# Patient Record
Sex: Male | Born: 1955
Health system: Southern US, Community
[De-identification: ages and names within clinical notes are randomized; demographics above are authoritative.]

## PROBLEM LIST (undated history)

## (undated) DIAGNOSIS — I10 Essential (primary) hypertension: Secondary | ICD-10-CM

## (undated) DIAGNOSIS — J45909 Unspecified asthma, uncomplicated: Secondary | ICD-10-CM

## (undated) DIAGNOSIS — M1990 Unspecified osteoarthritis, unspecified site: Secondary | ICD-10-CM

## (undated) HISTORY — PX: ORTHOPEDIC SURGERY: SHX850

## (undated) HISTORY — PX: TONSILLECTOMY: SUR1361

---

## 2012-09-22 ENCOUNTER — Encounter (HOSPITAL_COMMUNITY): Payer: Self-pay | Admitting: *Deleted

## 2012-09-22 ENCOUNTER — Observation Stay (HOSPITAL_COMMUNITY)
Admission: EM | Admit: 2012-09-22 | Discharge: 2012-09-23 | Disposition: A | Discharge status: Home / Self Care | Payer: PRIVATE HEALTH INSURANCE | Source: Home / Self Care | Admitting: Orthopedic Surgery

## 2012-09-22 ENCOUNTER — Encounter (HOSPITAL_COMMUNITY): Admission: EM | Disposition: A | Discharge status: Home / Self Care | Payer: Self-pay | Source: Home / Self Care

## 2012-09-22 ENCOUNTER — Emergency Department (HOSPITAL_COMMUNITY): Payer: PRIVATE HEALTH INSURANCE

## 2012-09-22 ENCOUNTER — Emergency Department (HOSPITAL_COMMUNITY)
Admission: EM | Admit: 2012-09-22 | Discharge: 2012-09-22 | Disposition: A | Discharge status: Home / Self Care | Payer: PRIVATE HEALTH INSURANCE | Attending: Orthopedic Surgery | Admitting: Orthopedic Surgery

## 2012-09-22 ENCOUNTER — Emergency Department (HOSPITAL_COMMUNITY): Payer: PRIVATE HEALTH INSURANCE | Admitting: *Deleted

## 2012-09-22 ENCOUNTER — Encounter (HOSPITAL_COMMUNITY): Payer: Self-pay

## 2012-09-22 DIAGNOSIS — S61209A Unspecified open wound of unspecified finger without damage to nail, initial encounter: Secondary | ICD-10-CM | POA: Insufficient documentation

## 2012-09-22 DIAGNOSIS — Y9389 Activity, other specified: Secondary | ICD-10-CM | POA: Insufficient documentation

## 2012-09-22 DIAGNOSIS — Z79899 Other long term (current) drug therapy: Secondary | ICD-10-CM | POA: Insufficient documentation

## 2012-09-22 DIAGNOSIS — S62639B Displaced fracture of distal phalanx of unspecified finger, initial encounter for open fracture: Secondary | ICD-10-CM | POA: Insufficient documentation

## 2012-09-22 DIAGNOSIS — I10 Essential (primary) hypertension: Secondary | ICD-10-CM | POA: Insufficient documentation

## 2012-09-22 DIAGNOSIS — W540XXA Bitten by dog, initial encounter: Secondary | ICD-10-CM | POA: Insufficient documentation

## 2012-09-22 DIAGNOSIS — F17200 Nicotine dependence, unspecified, uncomplicated: Secondary | ICD-10-CM | POA: Insufficient documentation

## 2012-09-22 DIAGNOSIS — Z791 Long term (current) use of non-steroidal anti-inflammatories (NSAID): Secondary | ICD-10-CM | POA: Insufficient documentation

## 2012-09-22 DIAGNOSIS — M129 Arthropathy, unspecified: Secondary | ICD-10-CM | POA: Insufficient documentation

## 2012-09-22 DIAGNOSIS — S61259A Open bite of unspecified finger without damage to nail, initial encounter: Secondary | ICD-10-CM

## 2012-09-22 DIAGNOSIS — Y929 Unspecified place or not applicable: Secondary | ICD-10-CM | POA: Insufficient documentation

## 2012-09-22 HISTORY — DX: Unspecified osteoarthritis, unspecified site: M19.90

## 2012-09-22 HISTORY — PX: PERCUTANEOUS PINNING: SHX2209

## 2012-09-22 HISTORY — DX: Unspecified asthma, uncomplicated: J45.909

## 2012-09-22 HISTORY — DX: Essential (primary) hypertension: I10

## 2012-09-22 LAB — BASIC METABOLIC PANEL
BUN: 11 mg/dL (ref 6–23)
Calcium: 9.3 mg/dL (ref 8.4–10.5)
Creatinine, Ser: 1 mg/dL (ref 0.50–1.35)
GFR calc Af Amer: >90 mL/min (ref >90)
GFR calc non Af Amer: 82 mL/min — ABNORMAL LOW (ref >90)

## 2012-09-22 LAB — CBC
MCH: 31.8 pg (ref 26.0–34.0)
MCHC: 36.1 g/dL — ABNORMAL HIGH (ref 30.0–36.0)
MCV: 88.2 fL (ref 78.0–100.0)
Platelets: 282 10*3/uL (ref 150–400)
RDW: 12.6 % (ref 11.5–15.5)
WBC: 8.6 10*3/uL (ref 4.0–10.5)

## 2012-09-22 SURGERY — PINNING, EXTREMITY, PERCUTANEOUS
Anesthesia: General | Site: Hand | Laterality: Bilateral | Wound class: Dirty or Infected

## 2012-09-22 SURGERY — PINNING, EXTREMITY, PERCUTANEOUS
Anesthesia: General | Laterality: Bilateral

## 2012-09-22 MED ORDER — HYDROMORPHONE HCL PF 1 MG/ML IJ SOLN: 0.5000 mg | INTRAMUSCULAR | Status: DC | PRN

## 2012-09-22 MED ORDER — HYDROMORPHONE HCL PF 1 MG/ML IJ SOLN: 0.2500 mg | INTRAMUSCULAR | Status: DC | PRN

## 2012-09-22 MED ORDER — NEOSTIGMINE METHYLSULFATE 1 MG/ML IJ SOLN
INTRAMUSCULAR | Status: DC | PRN
  Administered 2012-09-22: 5 mg via INTRAVENOUS

## 2012-09-22 MED ORDER — KCL IN DEXTROSE-NACL 20-5-0.45 MEQ/L-%-% IV SOLN
INTRAVENOUS | Status: DC
  Administered 2012-09-23: via INTRAVENOUS
  Filled 2012-09-22 (×2): qty 1000

## 2012-09-22 MED ORDER — VITAMIN C 500 MG PO TABS
1000.0000 mg | ORAL_TABLET | Freq: Every day | ORAL | Status: DC
  Administered 2012-09-23: 1000 mg via ORAL
  Filled 2012-09-22: qty 2

## 2012-09-22 MED ORDER — ONDANSETRON HCL 4 MG/2ML IJ SOLN
INTRAMUSCULAR | Status: DC | PRN
  Administered 2012-09-22: 4 mg via INTRAVENOUS

## 2012-09-22 MED ORDER — ADULT MULTIVITAMIN W/MINERALS CH
1.0000 | ORAL_TABLET | Freq: Every day | ORAL | Status: DC
  Administered 2012-09-23: 1 via ORAL
  Filled 2012-09-22: qty 1

## 2012-09-22 MED ORDER — ONDANSETRON HCL 4 MG PO TABS: 4.0000 mg | ORAL_TABLET | Freq: Four times a day (QID) | ORAL | Status: DC | PRN

## 2012-09-22 MED ORDER — ROCURONIUM BROMIDE 100 MG/10ML IV SOLN
INTRAVENOUS | Status: DC | PRN
  Administered 2012-09-22: 50 mg via INTRAVENOUS
  Administered 2012-09-22: 20 mg via INTRAVENOUS

## 2012-09-22 MED ORDER — LIDOCAINE HCL (CARDIAC) 20 MG/ML IV SOLN
INTRAVENOUS | Status: DC | PRN
  Administered 2012-09-22: 80 mg via INTRAVENOUS

## 2012-09-22 MED ORDER — GLYCOPYRROLATE 0.2 MG/ML IJ SOLN
INTRAMUSCULAR | Status: DC | PRN
  Administered 2012-09-22: 0.6 mg via INTRAVENOUS

## 2012-09-22 MED ORDER — DEXTROSE 5 % IV SOLN: 1.0000 g | Freq: Once | INTRAVENOUS | Status: DC

## 2012-09-22 MED ORDER — ONDANSETRON HCL 4 MG/2ML IJ SOLN: 4.0000 mg | Freq: Four times a day (QID) | INTRAMUSCULAR | Status: DC | PRN

## 2012-09-22 MED ORDER — HYDROCODONE-ACETAMINOPHEN 7.5-325 MG PO TABS
1.0000 | ORAL_TABLET | ORAL | Status: DC | PRN
  Administered 2012-09-23 (×3): 2 via ORAL
  Filled 2012-09-22 (×3): qty 2

## 2012-09-22 MED ORDER — HYDROMORPHONE HCL PF 1 MG/ML IJ SOLN
1.0000 mg | Freq: Once | INTRAMUSCULAR | Status: AC
  Administered 2012-09-22: 1 mg via INTRAMUSCULAR
  Filled 2012-09-22: qty 1

## 2012-09-22 MED ORDER — OXYCODONE HCL 5 MG PO TABS
5.0000 mg | ORAL_TABLET | ORAL | Status: DC | PRN
  Filled 2012-09-22: qty 2

## 2012-09-22 MED ORDER — MIDAZOLAM HCL 5 MG/5ML IJ SOLN
INTRAMUSCULAR | Status: DC | PRN
  Administered 2012-09-22: 2 mg via INTRAVENOUS

## 2012-09-22 MED ORDER — AMPICILLIN-SULBACTAM SODIUM 3 (2-1) G IJ SOLR
3.0000 g | Freq: Four times a day (QID) | INTRAMUSCULAR | Status: DC
  Administered 2012-09-23 (×4): 3 g via INTRAVENOUS
  Filled 2012-09-22 (×7): qty 3

## 2012-09-22 MED ORDER — DEXAMETHASONE SODIUM PHOSPHATE 4 MG/ML IJ SOLN
INTRAMUSCULAR | Status: DC | PRN
  Administered 2012-09-22: 10 mg via INTRAVENOUS

## 2012-09-22 MED ORDER — DOCUSATE SODIUM 100 MG PO CAPS
100.0000 mg | ORAL_CAPSULE | Freq: Two times a day (BID) | ORAL | Status: DC
Start: 2012-09-23 — End: 2012-09-23
  Administered 2012-09-23: 100 mg via ORAL
  Filled 2012-09-22 (×3): qty 1

## 2012-09-22 MED ORDER — DIPHENHYDRAMINE HCL 25 MG PO CAPS: 25.0000 mg | ORAL_CAPSULE | Freq: Four times a day (QID) | ORAL | Status: DC | PRN

## 2012-09-22 MED ORDER — BUPIVACAINE HCL (PF) 0.25 % IJ SOLN
INTRAMUSCULAR | Status: AC
  Filled 2012-09-22: qty 30

## 2012-09-22 MED ORDER — PROPOFOL 10 MG/ML IV BOLUS
INTRAVENOUS | Status: DC | PRN
  Administered 2012-09-22: 180 mg via INTRAVENOUS

## 2012-09-22 MED ORDER — DEXTROSE 5 % IV SOLN
1.0000 g | Freq: Once | INTRAVENOUS | Status: AC
  Administered 2012-09-22: 1 g via INTRAVENOUS
  Filled 2012-09-22: qty 10

## 2012-09-22 MED ORDER — OXYCODONE HCL 5 MG/5ML PO SOLN: 5.0000 mg | Freq: Once | ORAL | Status: DC | PRN

## 2012-09-22 MED ORDER — FENTANYL CITRATE 0.05 MG/ML IJ SOLN
INTRAMUSCULAR | Status: DC | PRN
  Administered 2012-09-22: 100 ug via INTRAVENOUS
  Administered 2012-09-22 (×3): 50 ug via INTRAVENOUS
  Administered 2012-09-22: 100 ug via INTRAVENOUS

## 2012-09-22 MED ORDER — CLINDAMYCIN PHOSPHATE 900 MG/50ML IV SOLN
900.0000 mg | INTRAVENOUS | Status: AC
  Administered 2012-09-22: 900 mg via INTRAVENOUS
  Filled 2012-09-22: qty 50

## 2012-09-22 MED ORDER — BACITRACIN-NEOMYCIN-POLYMYXIN 400-5-5000 EX OINT
TOPICAL_OINTMENT | Freq: Once | CUTANEOUS | Status: AC
  Administered 2012-09-22: 13:00:00 via TOPICAL
  Filled 2012-09-22: qty 1

## 2012-09-22 MED ORDER — OXYCODONE HCL 5 MG PO TABS: 5.0000 mg | ORAL_TABLET | Freq: Once | ORAL | Status: DC | PRN

## 2012-09-22 MED ORDER — FENTANYL CITRATE 0.05 MG/ML IJ SOLN
50.0000 ug | Freq: Once | INTRAMUSCULAR | Status: AC
  Administered 2012-09-22: 50 ug via INTRAMUSCULAR
  Filled 2012-09-22: qty 2

## 2012-09-22 MED ORDER — ZOLPIDEM TARTRATE 5 MG PO TABS: 5.0000 mg | ORAL_TABLET | Freq: Every evening | ORAL | Status: DC | PRN

## 2012-09-22 MED ORDER — METHOCARBAMOL 500 MG PO TABS: 500.0000 mg | ORAL_TABLET | Freq: Four times a day (QID) | ORAL | Status: DC | PRN

## 2012-09-22 MED ORDER — ARTIFICIAL TEARS OP OINT
TOPICAL_OINTMENT | OPHTHALMIC | Status: DC | PRN
  Administered 2012-09-22: 1 via OPHTHALMIC

## 2012-09-22 MED ORDER — DEXTROSE 5 % IV SOLN
500.0000 mg | Freq: Four times a day (QID) | INTRAVENOUS | Status: DC | PRN
  Filled 2012-09-22: qty 5

## 2012-09-22 MED ORDER — LIDOCAINE HCL (PF) 1 % IJ SOLN
INTRAMUSCULAR | Status: AC
  Filled 2012-09-22: qty 15

## 2012-09-22 MED ORDER — DEXTROSE 5 % IV SOLN
INTRAVENOUS | Status: DC | PRN
  Administered 2012-09-22: 21:00:00 via INTRAVENOUS

## 2012-09-22 MED ORDER — LACTATED RINGERS IV SOLN
INTRAVENOUS | Status: DC | PRN
  Administered 2012-09-22 (×2): via INTRAVENOUS

## 2012-09-22 MED ORDER — BUPIVACAINE HCL 0.25 % IJ SOLN
INTRAMUSCULAR | Status: DC | PRN
  Administered 2012-09-22: 6 mL
  Administered 2012-09-22: 8 mL

## 2012-09-22 MED ORDER — 0.9 % SODIUM CHLORIDE (POUR BTL) OPTIME
TOPICAL | Status: DC | PRN
  Administered 2012-09-22 (×2): 1000 mL

## 2012-09-22 SURGICAL SUPPLY — 54 items
BANDAGE CONFORM 2  STR LF (GAUZE/BANDAGES/DRESSINGS) ×6 IMPLANT
BANDAGE ELASTIC 3 VELCRO ST LF (GAUZE/BANDAGES/DRESSINGS) IMPLANT
BANDAGE ELASTIC 4 VELCRO ST LF (GAUZE/BANDAGES/DRESSINGS) IMPLANT
BANDAGE GAUZE ELAST BULKY 4 IN (GAUZE/BANDAGES/DRESSINGS) IMPLANT
BENZOIN TINCTURE PRP APPL 2/3 (GAUZE/BANDAGES/DRESSINGS) IMPLANT
BLADE SURG ROTATE 9660 (MISCELLANEOUS) IMPLANT
BNDG COHESIVE 1X5 TAN STRL LF (GAUZE/BANDAGES/DRESSINGS) ×4 IMPLANT
BNDG ESMARK 4X9 LF (GAUZE/BANDAGES/DRESSINGS) ×2 IMPLANT
CANISTER SUCTION 2500CC (MISCELLANEOUS) ×2 IMPLANT
CLOTH BEACON ORANGE TIMEOUT ST (SAFETY) ×2 IMPLANT
COVER SURGICAL LIGHT HANDLE (MISCELLANEOUS) ×2 IMPLANT
CUFF TOURNIQUET SINGLE 18IN (TOURNIQUET CUFF) ×4 IMPLANT
CUFF TOURNIQUET SINGLE 24IN (TOURNIQUET CUFF) IMPLANT
DRAPE C-ARM MINI 42X72 WSTRAPS (DRAPES) ×2 IMPLANT
DRAPE SURG 17X23 STRL (DRAPES) ×4 IMPLANT
DRSG EMULSION OIL 3X3 NADH (GAUZE/BANDAGES/DRESSINGS) ×4 IMPLANT
GAUZE SPONGE 2X2 8PLY STRL LF (GAUZE/BANDAGES/DRESSINGS) ×3 IMPLANT
GAUZE XEROFORM 1X8 LF (GAUZE/BANDAGES/DRESSINGS) IMPLANT
GLOVE BIOGEL PI IND STRL 7.5 (GLOVE) ×2 IMPLANT
GLOVE BIOGEL PI IND STRL 8 (GLOVE) ×1 IMPLANT
GLOVE BIOGEL PI IND STRL 8.5 (GLOVE) ×3 IMPLANT
GLOVE BIOGEL PI INDICATOR 7.5 (GLOVE) ×2
GLOVE BIOGEL PI INDICATOR 8 (GLOVE) ×1
GLOVE BIOGEL PI INDICATOR 8.5 (GLOVE) ×3
GLOVE SURG ORTHO 8.0 STRL STRW (GLOVE) ×6 IMPLANT
GLOVE SURG SS PI 7.5 STRL IVOR (GLOVE) ×6 IMPLANT
GOWN SRG XL XLNG 56XLVL 4 (GOWN DISPOSABLE) ×4 IMPLANT
GOWN STRL NON-REIN LRG LVL3 (GOWN DISPOSABLE) IMPLANT
GOWN STRL NON-REIN XL XLG LVL4 (GOWN DISPOSABLE) ×4
KIT BASIN OR (CUSTOM PROCEDURE TRAY) ×2 IMPLANT
KIT ROOM TURNOVER OR (KITS) ×2 IMPLANT
KWIRE 4.0 X .045IN (WIRE) ×2 IMPLANT
MANIFOLD NEPTUNE II (INSTRUMENTS) IMPLANT
NEEDLE HYPO 25GX1X1/2 BEV (NEEDLE) ×2 IMPLANT
NS IRRIG 1000ML POUR BTL (IV SOLUTION) ×4 IMPLANT
PACK ORTHO EXTREMITY (CUSTOM PROCEDURE TRAY) ×2 IMPLANT
PAD ARMBOARD 7.5X6 YLW CONV (MISCELLANEOUS) ×4 IMPLANT
SCRUB FOAM CHG 2% SURGICAL (MISCELLANEOUS) ×4 IMPLANT
SPONGE GAUZE 2X2 STER 10/PKG (GAUZE/BANDAGES/DRESSINGS) ×3
SPONGE GAUZE 4X4 12PLY (GAUZE/BANDAGES/DRESSINGS) IMPLANT
STRIP CLOSURE SKIN 1/2X4 (GAUZE/BANDAGES/DRESSINGS) IMPLANT
SUT CHROMIC 5 0 P 3 (SUTURE) ×4 IMPLANT
SUT ETHILON 4 0 P 3 18 (SUTURE) IMPLANT
SUT ETHILON 5 0 P 3 18 (SUTURE)
SUT MERSILENE 4 0 P 3 (SUTURE) ×2 IMPLANT
SUT NYLON ETHILON 5-0 P-3 1X18 (SUTURE) IMPLANT
SUT PROLENE 4 0 P 3 18 (SUTURE) ×4 IMPLANT
SUT PROLENE 4 0 RB 1 (SUTURE) ×1
SUT PROLENE 4-0 RB1 18X2 ARM (SUTURE) ×1 IMPLANT
SYR CONTROL 10ML LL (SYRINGE) ×2 IMPLANT
TOWEL OR 17X24 6PK STRL BLUE (TOWEL DISPOSABLE) ×2 IMPLANT
TOWEL OR 17X26 10 PK STRL BLUE (TOWEL DISPOSABLE) ×2 IMPLANT
TUBE CONNECTING 12X1/4 (SUCTIONS) ×2 IMPLANT
WATER STERILE IRR 1000ML POUR (IV SOLUTION) IMPLANT

## 2012-09-22 NOTE — ED Notes (Signed)
PT is here to see Dr. Melvyn Novas  For animal bites to bilateral hands from a dog he knows.  Rabies shot up to date

## 2012-09-22 NOTE — ED Notes (Signed)
Family at bedside. 

## 2012-09-22 NOTE — Anesthesia Postprocedure Evaluation (Signed)
Anesthesia Post Note  Patient: Justin Gill  Procedure(s) Performed: Procedure(s) (LRB): Bilateral hands/I&D and Repair as Needed (Bilateral)  Anesthesia type: General  Patient location: PACU  Post pain: Pain level controlled and Adequate analgesia  Post assessment: Post-op Vital signs reviewed, Patient's Cardiovascular Status Stable, Respiratory Function Stable, Patent Airway and Pain level controlled  Last Vitals:  Filed Vitals:   09/22/12 2300  BP: 145/92  Pulse:   Temp:   Resp:     Post vital signs: Reviewed and stable  Level of consciousness: awake, alert  and oriented  Complications: No apparent anesthesia complications

## 2012-09-22 NOTE — Preoperative (Signed)
Beta Blockers   Reason not to administer Beta Blockers:Not Applicable 

## 2012-09-22 NOTE — Transfer of Care (Signed)
Immediate Anesthesia Transfer of Care Note  Patient: Justin Gill  Procedure(s) Performed: Procedure(s): Bilateral hands/I&D and Repair as Needed (Bilateral)  Patient Location: PACU  Anesthesia Type:General  Level of Consciousness: oriented, sedated, patient cooperative and responds to stimulation  Airway & Oxygen Therapy: Patient Spontanous Breathing and Patient connected to nasal cannula oxygen  Post-op Assessment: Report given to PACU RN, Post -op Vital signs reviewed and stable, Patient moving all extremities and Patient moving all extremities X 4  Post vital signs: Reviewed and stable  Complications: No apparent anesthesia complications

## 2012-09-22 NOTE — ED Provider Notes (Addendum)
History    This chart was scribed for Vida Roller, MD by Charolett Bumpers, ED Scribe. The patient was seen in room APA06/APA06. Patient's care was started at 1031.   CSN: [id number]  Arrival date & time 09/22/12  1013   First MD Initiated Contact with Patient 09/22/12 1031      Chief Complaint  Patient presents with  . Animal Bite    The history is provided by the patient. No language interpreter was used.   Justin Gill is a 57 y.o. male who presents to the Emergency Department complaining of constant, moderate bilaterally hand pain with associated wounds after a dog bite that occurred last night. He has dog bites to his left pinky, right ring finger and right pinky fingers. He states that he was giving a bone to one of his dogs when one of his pit bulls bit him on both of his hands. He states that the dog's immunizations and rabies are UTD per pt. He went to his PCP this morning where his fingers were dressed. He states the fingers are mangled and the left pinky is severely damaged. He denies any other injuries. His tetanus is UTD.  Past Medical History  Diagnosis Date  . Hypertension   . Arthritis     Past Surgical History  Procedure Laterality Date  . Tonsillectomy    . Orthopedic surgery      No family history on file.  History  Substance Use Topics  . Smoking status: Current Every Day Smoker  . Smokeless tobacco: Not on file  . Alcohol Use: Yes      Review of Systems A complete 10 system review of systems was obtained and all systems are negative except as noted in the HPI and PMH.   Allergies  Review of patient's allergies indicates no known allergies.  Home Medications   Current Outpatient Rx  Name  Route  Sig  Dispense  Refill  . albuterol (PROVENTIL HFA;VENTOLIN HFA) 108 (90 BASE) MCG/ACT inhaler   Inhalation   Inhale 2 puffs into the lungs every 6 (six) hours as needed for wheezing.         Marland Kitchen amLODipine (NORVASC) 5 MG tablet   Oral   Take  5 mg by mouth daily.         . mometasone-formoterol (DULERA) 200-5 MCG/ACT AERO   Inhalation   Inhale 2 puffs into the lungs daily.         . naproxen sodium (ALEVE) 220 MG tablet   Oral   Take 220 mg by mouth 2 (two) times daily as needed.           BP 139/86  Pulse 100  Temp(Src) 97.8 F (36.6 C) (Oral)  Resp 20  Ht 5\' 11"  (1.803 m)  Wt 178 lb (80.74 kg)  BMI 24.84 kg/m2  SpO2 96%  Physical Exam  Nursing note and vitals reviewed. Constitutional: He is oriented to person, place, and time. He appears well-developed and well-nourished. No distress.  HENT:  Head: Normocephalic and atraumatic.  Eyes: EOM are normal. Pupils are equal, round, and reactive to light.  Neck: Normal range of motion. Neck supple. No tracheal deviation present.  Cardiovascular: Normal rate, regular rhythm and normal heart sounds.   Pulmonary/Chest: Effort normal and breath sounds normal. No respiratory distress.  Abdominal: Soft. He exhibits no distension.  Musculoskeletal: Normal range of motion. He exhibits no edema.  Left little finger with deep laceration through the mid distal phalanx. X-ray  confirms a fracture of the same bone. On the right hand, dorsal surface of ring and little fingers mangled. Right ring finger with distal tuff involved and open, missing soft tissue. Right little finger with laceration and mangled dorsal surface.   Neurological: He is alert and oriented to person, place, and time.  Skin: Skin is warm and dry.  Psychiatric: He has a normal mood and affect. His behavior is normal.    ED Course  Procedures (including critical care time)  DIAGNOSTIC STUDIES: Oxygen Saturation is 96% on room air, adequate by my interpretation.    COORDINATION OF CARE:  10:45-Medication Orders: Hydromorphone (Dilaudid) injection 1 mg-once.   11:25-Discussed planned course of treatment with the patient including a digital block and wound cleaning, who is agreeable at this time. Will  have pt f/u with hand surgery.  11:38-Medication Orders: Lidocaine (PF) (Xylocaine) 1% injection.  11:40-Preformed digital block and and cleaning of wounds.   12:10-Spoke with hand surgery. Pt to go to [facility] ED for repair. He is agreeable with plan.  Labs Reviewed - No data to display Dg Hand Complete Left  09/22/2012  *RADIOLOGY REPORT*  Clinical Data: Injury, laceration.  LEFT HAND - COMPLETE 3+ VIEW  Comparison: None.  Findings: There is a large laceration of the distal little finger. The fracture extends through the neck of the distal phalanx without marked displacement.  Bandaging is present but no foreign body is identified.  IMPRESSION: Laceration of the little finger extends through the neck of the distal phalanx.   Original Report Authenticated By: Holley Dexter, M.D.    Dg Hand Complete Right  09/22/2012  *RADIOLOGY REPORT*  Clinical Data: Ring and little finger lacerations.  RIGHT HAND - COMPLETE 3+ VIEW  Comparison: None.  Findings: Large appearing laceration is seen involving the distal aspect of the ring finger.  The distal most aspect of the tuft of the ring finger has been amputated.  Laceration of the little finger is also identified without fracture.  No foreign body is seen.  There is scattered degenerative disease present with small osteophytes noted about the DIP joint of the little finger.  IMPRESSION:  1.  Large laceration distal ring finger involves the tip of the tuft of the distal phalanx. 2.  Laceration little finger without fracture.   Original Report Authenticated By: Holley Dexter, M.D.      1. Animal bite of finger, initial encounter       MDM  I have discussed the patient's care with the hand surgeon Dr. Melvyn Novas who has requested that the patient be sent to [facility] for further evaluation and possible procedure. I have given the patient antibiotics, cleaned and dressed his wounds and placed antibiotic cream on them. He has also had a  digital block of his left small finger which has had significant improvement in his pain. The patient appears stable at this time, I personally reviewed and interpreted the x-rays and find her to be a laceration of the left small finger, significant soft tissue damage to all 3 fingers involved. The patient is stable for personal transport, the patient prefers to go by this route and appears stable to do so. He has a friend here to drive him.    I personally performed the services described in this documentation, which was scribed in my presence. The recorded information has been reviewed and is accurate.        Vida Roller, MD 09/22/12 1222  Vida Roller, MD  09/22/12 1222 

## 2012-09-22 NOTE — ED Notes (Signed)
Dressing changed for right hand.

## 2012-09-22 NOTE — Anesthesia Procedure Notes (Signed)
Procedure Name: Intubation Date/Time: 09/22/2012 8:57 PM Performed by: Wray Kearns A Pre-anesthesia Checklist: Patient identified, Timeout performed, Emergency Drugs available, Suction available and Patient being monitored Patient Re-evaluated:Patient Re-evaluated prior to inductionOxygen Delivery Method: Circle system utilized Preoxygenation: Pre-oxygenation with 100% oxygen Intubation Type: IV induction Ventilation: Mask ventilation without difficulty Laryngoscope Size: Mac and 4 Grade View: Grade II Tube type: Oral Tube size: 7.5 mm Number of attempts: 1 Airway Equipment and Method: Stylet Placement Confirmation: ETT inserted through vocal cords under direct vision,  breath sounds checked- equal and bilateral and positive ETCO2 Secured at: 23 cm Tube secured with: Tape Dental Injury: Teeth and Oropharynx as per pre-operative assessment

## 2012-09-22 NOTE — ED Notes (Signed)
Left hand soaking in saline at present. Waiting to be sutured

## 2012-09-22 NOTE — ED Notes (Signed)
Consent for procedure signed.

## 2012-09-22 NOTE — Anesthesia Preprocedure Evaluation (Signed)
Anesthesia Evaluation  Patient identified by MRN, date of birth, ID band Patient awake    Reviewed: Allergy & Precautions, H&P , NPO status , Patient's Chart, lab work & pertinent test results  Airway Mallampati: II  Neck ROM: full    Dental   Pulmonary asthma , Current Smoker,          Cardiovascular hypertension,     Neuro/Psych    GI/Hepatic   Endo/Other    Renal/GU      Musculoskeletal  (+) Arthritis -,   Abdominal   Peds  Hematology   Anesthesia Other Findings   Reproductive/Obstetrics                           Anesthesia Physical Anesthesia Plan  ASA: II  Anesthesia Plan: General   Post-op Pain Management:    Induction: Intravenous  Airway Management Planned: LMA  Additional Equipment:   Intra-op Plan:   Post-operative Plan:   Informed Consent: I have reviewed the patients History and Physical, chart, labs and discussed the procedure including the risks, benefits and alternatives for the proposed anesthesia with the patient or authorized representative who has indicated his/her understanding and acceptance.     Plan Discussed with: CRNA and Surgeon  Anesthesia Plan Comments:         Anesthesia Quick Evaluation

## 2012-09-22 NOTE — ED Notes (Signed)
Pt states he was attempting to take a bone away from his pit bull last night and it bit him. States he went to his doctor this morning a fingers were dressed. States his fingers are mangled and the left pinky is about to fall off

## 2012-09-22 NOTE — H&P (Signed)
Justin Gill is an 57 y.o. male.   Chief Complaint:  PT BIT BY HOME PIT BULL LAST EVENING HPI: PRESENTED TO URGENT CARE AND [place] TODAY WITH OPEN WOUNDS TO FINGERS PT WITH NO PRIOR INJURY TO EITHER HANDS PT HERE FOR DEFINITIVE TREATMENT FOR BOTH HANDS THAT WERE MANGLED BY DOG  Past Medical History  Diagnosis Date  . Hypertension   . Arthritis   . Asthma     Past Surgical History  Procedure Laterality Date  . Tonsillectomy    . Orthopedic surgery      No family history on file. Social History:  reports that he has been smoking.  He does not have any smokeless tobacco history on file. He reports that  drinks alcohol. He reports that he does not use illicit drugs.  Allergies: No Known Allergies   (Not in a hospital admission)  No results found for this or any previous visit (from the past 48 hour(s)). Dg Hand Complete Left  09/22/2012  *RADIOLOGY REPORT*  Clinical Data: Injury, laceration.  LEFT HAND - COMPLETE 3+ VIEW  Comparison: None.  Findings: There is a large laceration of the distal little finger. The fracture extends through the neck of the distal phalanx without marked displacement.  Bandaging is present but no foreign body is identified.  IMPRESSION: Laceration of the little finger extends through the neck of the distal phalanx.   Original Report Authenticated By: Holley Dexter, M.D.    Dg Hand Complete Right  09/22/2012  *RADIOLOGY REPORT*  Clinical Data: Ring and little finger lacerations.  RIGHT HAND - COMPLETE 3+ VIEW  Comparison: None.  Findings: Large appearing laceration is seen involving the distal aspect of the ring finger.  The distal most aspect of the tuft of the ring finger has been amputated.  Laceration of the little finger is also identified without fracture.  No foreign body is seen.  There is scattered degenerative disease present with small osteophytes noted about the DIP joint of the little finger.  IMPRESSION:  1.  Large laceration distal ring finger  involves the tip of the tuft of the distal phalanx. 2.  Laceration little finger without fracture.   Original Report Authenticated By: Holley Dexter, M.D.     NO RECENT ILLNESSES OR HOSPITALIZATIONS  Blood pressure 130/86, pulse 95, temperature 98.8 F (37.1 C), temperature source Oral, resp. rate 19, SpO2 99.00%. General Appearance:  Alert, cooperative, no distress, appears stated age  Head:  Normocephalic, without obvious abnormality, atraumatic  Eyes:  Pupils equal, conjunctiva/corneas clear,         Throat: Lips, mucosa, and tongue normal; teeth and gums normal  Neck: No visible masses     Lungs:   respirations unlabored  Chest Wall:  No tenderness or deformity  Heart:  Regular rate and rhythm,  Abdomen:   Soft, non-tender,         Extremities: RIGHT HAND OPEN WOUNDS TO DORSUM OF SMALL FINGER AND RING FINGER WITH DISTAL TIP AMPUTATION TO RING FINGER ABLE TO FLEX AND EXTEND DIGITS NO WOUNDS TO LONG AND INDEX THUMB WARM WELL PERFUSED LEFT SMALL FINGER WITH OPEN DISTAL PHALANX FRACTURE AND EXPOSED NAIL BED NO WOUND TO LONG/RING/INDEX OR THUMB ON LEFT SIDE  Pulses: 2+ and symmetric  Skin: Skin color, texture, turgor normal, no rashes or lesions     Neurologic: Normal    Assessment/Plan LEFT SMALL FINGER OPEN DISTAL PHALANX FRACTURE RIGHT RING AND SMALL FINGER MANGLED DIGITS, DOG BITE  PLAN TO GO TO OR  TONIGHT FOR IRRIGATION AND DEBRIDEMENT AND REPAIR AS NECESSARY, REVISION AMPUTATION  PT VOICED UNDERSTANDING OF PLAN R/B/A DISCUSSED WITH PT IN ED.  PT VOICED UNDERSTANDING OF PLAN CONSENT SIGNED DAY OF SURGERY PT SEEN AND EXAMINED PRIOR TO OPERATIVE PROCEDURE/DAY OF SURGERY SITE MARKED. QUESTIONS ANSWERED WILL REMAIN AN INPATIENT FOLLOWING SURGERY  Sharma Covert 09/22/2012, 6:35 PM

## 2012-09-22 NOTE — Brief Op Note (Signed)
09/22/2012  7:58 PM  PATIENT:  Justin Gill  57 y.o. male  PRE-OPERATIVE DIAGNOSIS:  Dog Bite Bilateral Hands  POST-OPERATIVE DIAGNOSIS: same  PROCEDURE:  Procedure(s): Bilateral hands/I&D and Repair as Needed (Bilateral)  SURGEON:  Surgeon(s) and Role:    * Sharma Covert, MD - Primary  PHYSICIAN ASSISTANT:   ASSISTANTS: none   ANESTHESIA:   general  EBL:     BLOOD ADMINISTERED:none  DRAINS: none   LOCAL MEDICATIONS USED:  NONE  SPECIMEN:  No Specimen  DISPOSITION OF SPECIMEN:  N/A  COUNTS:  YES  TOURNIQUET:    DICTATION: .Other Dictation: Dictation Number [id number]  PLAN OF CARE: Admit for overnight observation  PATIENT DISPOSITION:  PACU - hemodynamically stable.   Delay start of Pharmacological VTE agent (>24hrs) due to surgical blood loss or risk of bleeding: not applicable

## 2012-09-23 ENCOUNTER — Encounter (HOSPITAL_COMMUNITY): Payer: Self-pay | Admitting: Orthopedic Surgery

## 2012-09-23 MED ORDER — AMOXICILLIN-POT CLAVULANATE 875-125 MG PO TABS: 1.0000 | ORAL_TABLET | Freq: Two times a day (BID) | ORAL | Status: DC

## 2012-09-23 MED ORDER — DOCUSATE SODIUM 100 MG PO CAPS: 100.0000 mg | ORAL_CAPSULE | Freq: Two times a day (BID) | ORAL | Status: DC

## 2012-09-23 MED ORDER — OXYCODONE-ACETAMINOPHEN 10-325 MG PO TABS: 1.0000 | ORAL_TABLET | ORAL | Status: DC | PRN

## 2012-09-23 NOTE — Op Note (Signed)
NAMEGILMAN, OLAZABAL NO.:  [id number]  MEDICAL RECORD NO.:  [id number]  LOCATION:  APA06                         FACILITY:  [facility]  PHYSICIAN:  Madelynn Done, MD  DATE OF BIRTH:  1955-09-15  DATE OF PROCEDURE:  09/22/2012 DATE OF DISCHARGE:  09/22/2012                              OPERATIVE REPORT   PREOPERATIVE DIAGNOSIS:  Left long finger open distal phalanx fracture.  POSTOPERATIVE DIAGNOSIS:  Left long finger open distal phalanx fracture.  ATTENDING PHYSICIAN:  Bradly Bienenstock, MD who was scrubbed and present for the entire procedure.  ASSISTANT SURGEON:  None.  SURGICAL PROCEDURE: 1. Open debridement of skin, subcutaneous tissue, and bone associated     with open fracture, left long finger. 2. Open treatment of left small finger distal phalanx fracture     requiring internal fixation. 3. Left small finger nail bed repair. 4. Radiographs 2 views left small finger.  SURGICAL IMPLANTS:  0.045 K-wire.  SURGICAL INDICATIONS:  Mr. Yo is a 57 year old gentleman who was bit by a pit bull sustaining a near amputation in the left small finger. The patient had previously undergone surgery on his right hand and was here for the above-mentioned procedures.  DESCRIPTION OF PROCEDURE:  The patient was properly identified in the preop holding area and a mark with a permanent marker made in the left small finger to indicate the correct operative site.  The patient was then brought back to the operating room and placed supine on the anesthesia room table where general anesthetic was done first on the left side.  A well-padded tourniquet was then applied in the left forearm and sealed with 1000 drape.  Left upper extremity was then prepped and draped in normal sterile fashion.  Time-out was called, correct side was identified, and procedure was then begun.  Attention was then turned to the small finger where the debridement of skin, subcutaneous tissue, and bone  was then carried out.  Excisional debridement was carried out with sharp knives and rongeurs.  After excisional debridement of the devitalized tissue, the wound was then copiously irrigated.  Copious wound irrigation done throughout.  After the wound was then thoroughly irrigated, the 0.045 K-wire was then placed retrograde across the fracture with good purchase across the distal interphalangeal joint holding the DIP joint in full extension. Following this, the nail bed was then repaired using simple 5-0 chromic sutures.  Final radiographs were then obtained.  Wound was then irrigated.  Tourniquet was deflated.  Tendon was cut and then left out of the skin.  Adaptic dressing, sterile compressive bandage was applied. The patient tolerated the procedure well, and was placed in a finger splint, extubated and taken to recovery room in good condition.  POSTPROCEDURE PLAN:  The patient will be admitted for IV antibiotics, pain control, discharge in the morning, seen back in the office in 1 week for x-rays, pin check, wound check, tip protector splints for his left hand and his right hand.  Radiographs at each visit.  Pinning for a total of 3-4 weeks.     Madelynn Done, MD     FWO/MEDQ  D:  09/22/2012  T:  09/23/2012  Job:  [id number]

## 2012-09-23 NOTE — Op Note (Signed)
NAMELEMONTE, AL NO.:  [id number]  MEDICAL RECORD NO.:  [id number]  LOCATION:  [id number]                        FACILITY:  [facility]  PHYSICIAN:  Madelynn Done, MD  DATE OF BIRTH:  March 25, 1956  DATE OF PROCEDURE:  09/22/2012 DATE OF DISCHARGE:                              OPERATIVE REPORT   PREOPERATIVE DIAGNOSES: 1. Right small finger dog bite with open extensor mechanism     insufficiency, zone II. 2. Right ring finger open distal phalanx fracture with distal tip     amputation and soft tissue loss. 3. Right ring finger extensive dog bite, dorsal aspect of right ring     finger, 5 cm.  POSTOPERATIVE DIAGNOSES: 1. Right small finger dog bite with open extensor mechanism     insufficiency, zone II. 2. Right ring finger open distal phalanx fracture with distal tip     amputation and soft tissue loss. 3. Right ring finger extensive dog bite, dorsal aspect of right ring     finger, 5 cm.  ATTENDING PHYSICIAN:  Madelynn Done, MD who scrubbed and present for the entire procedure.  ASSISTANT SURGEON:  None.  ANESTHESIA:  General via LMA endotracheal tube.  TOURNIQUET TIME:  36 minutes at 250 mmHg.  SURGICAL INDICATIONS:  Mr. Hoard is a 57 year old right-hand-dominant gentleman who was at home sustained an injury to his right and left hand.  Risks, benefits, and alternatives were discussed in detail with the patient and signed informed consent was obtained.  Risks include, but not limited to bleeding, infection, damage to nearby nerves, arteries, or tendons, loss of motion of the elbow, wrist, and digits, incomplete relief of symptoms, and need for further surgical intervention.  SURGICAL PROCEDURE: 1. Right small finger debridement of skin, subcutaneous tissue, open     dog bite, excisional debridement 4 cm wound. 2. Right small finger zone II extensor tendon repair. 3. Right ring finger extensive debridement of skin, subcutaneous     tissue,  tendon, and bone, excisional debridement, 5 cm traumatic     wound. 4. Right ring finger open treatment of distal phalangeal fracture with     debridement of bone. 5. Right ring finger V-Y advancement flap closure. 6. Right ring finger zone IV extensor tendon repair.  DESCRIPTION OF PROCEDURE:  The patient was properly identified in the preop holding area and mark with a permanent marker made on the right ring and small fingers to indicate the correct operative sites.  The patient was then brought back to the operating room, placed supine on the anesthesia room table where general endotracheal anesthesia was administered.  The patient tolerated this well.  A well-padded tourniquet was then placed in the right forearm and sealed with 1000 drape.  Right upper extremity was then prepped and draped in normal sterile fashion.  Time-out was called, correct site was identified, and procedure then begun.  Attention was then turned to the right small finger.  The patient did have a 5 cm dorsal laceration.  Extensive debridement was then carried out sharply with a knife and sharp scissors and rongeurs.  Devitalized tissue was then carried out all the way down to the  extensor mechanism.  The extensor mechanism did have the insufficiency along the central slip at the level of zone II/zone III. After debridement, the extensor mechanism then repaired with a figure-of- eight horizontal simple 4-0 Mersilene sutures.  The wound was then thoroughly irrigated.  The traumatic laceration was then loosely reapproximated with simple Prolene sutures.  Following this, attention was then turned to the ring finger where the traumatic laceration was extended.  Excisional debridement was then carried out in the ring finger dorsally skin and subcutaneous tissue all the way down to the extensor mechanism.  The patient did have injury to the extensor tendon, and a longitudinal laceration directly over the dorsal  aspect of the zone IV.  This was repaired with 4-0 Mersilene suture.  After excisional debridement, the wound was then thoroughly irrigated.  The patient did have the distal tip amputation with exposed bone.  Following this, debridement of the bone was then carried out with a rongeur, taken back to a stable rim.  A V-Y advancement flap was then fashioned volarly.  V- Y advancement flap was then carried out to cover the soft tissue.  This was sewn in place with 5-0 chromic suture.  The wounds were then thoroughly irrigated.  Copious irrigation and loose reapproximation of wounds were carried out through each level.  The skin was then closed at the dorsal aspect of the finger with Prolene suture.  Adaptic dressing, sterile compressive bandage were then applied.  The tourniquet was deflated with good perfusion of the fingers.  Finger splints were then applied.  The patient tolerated the procedure well.  POSTPROCEDURE PLAN:  The patient will be admitted for IV antibiotics and pain control.  Discharged in the morning likely on oral antibiotics. Follow him closely and begin an aggressive therapy regimen to try to get back his mobility and function.  A separate dictation will be done for the left side which was done under the same anesthetic.     Madelynn Done, MD     FWO/MEDQ  D:  09/22/2012  T:  09/23/2012  Job:  [id number]

## 2012-09-23 NOTE — Plan of Care (Signed)
Problem: Phase I Progression Outcomes Goal: Sutures/staples intact Outcome: Not Applicable Date Met:  09/23/12 Unable to visualize due to fingers splinted and wrapped in compression wrap  Problem: Phase II Progression Outcomes Goal: Sutures/staples intact Outcome: Not Applicable Date Met:  09/23/12 Unable to visualize due to fingers splinted and wrapped with compression wrap.

## 2012-09-23 NOTE — Discharge Summary (Signed)
Physician Discharge Summary  Patient ID: Jomarion Mish MRN: [id number] DOB/AGE: August 25, 1955 57 y.o.  Admit date: 09/22/2012 Discharge date: 09/23/2012  Admission Diagnoses: Dog bite Bilateral Hands Past Medical History  Diagnosis Date  . Hypertension   . Arthritis   . Asthma     Discharge Diagnoses:  Dog Bite Bilateral Hands  Surgeries: Procedure(s): Bilateral hands/I&D and Repair as Needed on 09/22/2012    Consultants:  NOne  Discharged Condition: Improved  Hospital Course: Aldric Wenzler is an 57 y.o. male who was admitted 09/22/2012 with a chief complaint of  Chief Complaint  Patient presents with  . Dog bite to both hands   , and found to have a diagnosis of Dog bite Bilateral Hands.  They were brought to the operating room on 09/22/2012 and underwent Procedure(s): Bilateral hands/I&D and Repair as Needed.    They were given perioperative antibiotics: Anti-infectives   Start     Dose/Rate Route Frequency Ordered Stop   09/23/12 0000  Ampicillin-Sulbactam (UNASYN) 3 g in sodium chloride 0.9 % 100 mL IVPB     3 g 100 mL/hr over 60 Minutes Intravenous Every 6 hours 09/22/12 2357     09/23/12 0000  amoxicillin-clavulanate (AUGMENTIN) 875-125 MG per tablet     1 tablet Oral 2 times daily 09/23/12 1846     09/22/12 2100  [MAR Hold]  clindamycin (CLEOCIN) IVPB 900 mg     (On MAR Hold since 09/22/12 2109)   900 mg 100 mL/hr over 30 Minutes Intravenous To Surgery 09/22/12 2049 09/22/12 2055    .  They were given sequential compression devices, early ambulation, and Other (comment)early ambulation for DVT prophylaxis.  Recent vital signs: Patient Vitals for the past 24 hrs:  BP Temp Temp src Pulse Resp SpO2 Height Weight  09/23/12 1714 - - - - - - 5\' 11"  (1.803 m) 80.74 kg (178 lb)  09/23/12 1408 125/71 mmHg 98.4 F (36.9 C) - 90 20 97 % - -  09/23/12 0710 115/54 mmHg 97.9 F (36.6 C) - 87 18 96 % - -  09/22/12 2356 145/92 mmHg 98.5 F (36.9 C) Oral 79 18 95 % - -  09/22/12  2329 154/86 mmHg - - - - - - -  09/22/12 2315 157/96 mmHg 99.1 F (37.3 C) - - - - - -  09/22/12 2300 145/92 mmHg - - - - - - -  09/22/12 2245 126/83 mmHg - - - - - - -  09/22/12 2235 139/88 mmHg 98.2 F (36.8 C) - - 17 95 % - -  09/22/12 1935 150/91 mmHg - - - 16 98 % - -  .  Recent laboratory studies: Dg Hand Complete Left  09/22/2012  *RADIOLOGY REPORT*  Clinical Data: Injury, laceration.  LEFT HAND - COMPLETE 3+ VIEW  Comparison: None.  Findings: There is a large laceration of the distal little finger. The fracture extends through the neck of the distal phalanx without marked displacement.  Bandaging is present but no foreign body is identified.  IMPRESSION: Laceration of the little finger extends through the neck of the distal phalanx.   Original Report Authenticated By: Holley Dexter, M.D.    Dg Hand Complete Right  09/22/2012  *RADIOLOGY REPORT*  Clinical Data: Ring and little finger lacerations.  RIGHT HAND - COMPLETE 3+ VIEW  Comparison: None.  Findings: Large appearing laceration is seen involving the distal aspect of the ring finger.  The distal most aspect of the tuft of the ring finger has been amputated.  Laceration of the little finger is also identified without fracture.  No foreign body is seen.  There is scattered degenerative disease present with small osteophytes noted about the DIP joint of the little finger.  IMPRESSION:  1.  Large laceration distal ring finger involves the tip of the tuft of the distal phalanx. 2.  Laceration little finger without fracture.   Original Report Authenticated By: Holley Dexter, M.D.     Discharge Medications:     Medication List    TAKE these medications       albuterol 108 (90 BASE) MCG/ACT inhaler  Commonly known as:  PROVENTIL HFA;VENTOLIN HFA  Inhale 2 puffs into the lungs every 6 (six) hours as needed for wheezing.     ALEVE 220 MG tablet  Generic drug:  naproxen sodium  Take 220 mg by mouth 2 (two) times daily as needed.      amLODipine 5 MG tablet  Commonly known as:  NORVASC  Take 5 mg by mouth daily.     amoxicillin-clavulanate 875-125 MG per tablet  Commonly known as:  AUGMENTIN  Take 1 tablet by mouth 2 (two) times daily.     B-1 PO  Take 1 tablet by mouth daily.     B-12 PO  Take 1 tablet by mouth daily.     B-6 PO  Take 1 tablet by mouth daily.     BEE POLLEN PO  Take 1 capsule by mouth daily.     docusate sodium 100 MG capsule  Commonly known as:  COLACE  Take 1 capsule (100 mg total) by mouth 2 (two) times daily.     mometasone-formoterol 200-5 MCG/ACT Aero  Commonly known as:  DULERA  Inhale 2 puffs into the lungs daily.     multivitamin with minerals Tabs  Take 1 tablet by mouth daily.     oxyCODONE-acetaminophen 10-325 MG per tablet  Commonly known as:  PERCOCET  Take 1 tablet by mouth every 4 (four) hours as needed for pain.     VITAMIN C PO  Take 1 tablet by mouth daily.        Diagnostic Studies: Dg Hand Complete Left  09/22/2012  *RADIOLOGY REPORT*  Clinical Data: Injury, laceration.  LEFT HAND - COMPLETE 3+ VIEW  Comparison: None.  Findings: There is a large laceration of the distal little finger. The fracture extends through the neck of the distal phalanx without marked displacement.  Bandaging is present but no foreign body is identified.  IMPRESSION: Laceration of the little finger extends through the neck of the distal phalanx.   Original Report Authenticated By: Holley Dexter, M.D.    Dg Hand Complete Right  09/22/2012  *RADIOLOGY REPORT*  Clinical Data: Ring and little finger lacerations.  RIGHT HAND - COMPLETE 3+ VIEW  Comparison: None.  Findings: Large appearing laceration is seen involving the distal aspect of the ring finger.  The distal most aspect of the tuft of the ring finger has been amputated.  Laceration of the little finger is also identified without fracture.  No foreign body is seen.  There is scattered degenerative disease present with small  osteophytes noted about the DIP joint of the little finger.  IMPRESSION:  1.  Large laceration distal ring finger involves the tip of the tuft of the distal phalanx. 2.  Laceration little finger without fracture.   Original Report Authenticated By: Holley Dexter, M.D.     They benefited maximally from their hospital stay and there were no complications.  Disposition: 01-Home or Self Care      Follow-up Information   Follow up with Sharma Covert, MD. Schedule an appointment as soon as possible for a visit in 1 week.   Contact information:   [address] [place] [address] [phone]        Signed: Sharma Covert 09/23/2012, 6:46 PM

## 2012-10-14 ENCOUNTER — Ambulatory Visit (INDEPENDENT_AMBULATORY_CARE_PROVIDER_SITE_OTHER): Payer: PRIVATE HEALTH INSURANCE | Admitting: Physician Assistant

## 2012-10-14 ENCOUNTER — Encounter: Payer: Self-pay | Admitting: Physician Assistant

## 2012-10-14 VITALS — BP 127/86 | HR 81 | Temp 97.9°F | Ht 71.0 in | Wt 180.2 lb

## 2012-10-14 DIAGNOSIS — B009 Herpesviral infection, unspecified: Secondary | ICD-10-CM

## 2012-10-14 MED ORDER — VALACYCLOVIR HCL 1 G PO TABS: 1000.0000 mg | ORAL_TABLET | Freq: Every day | ORAL | Status: DC

## 2012-10-14 MED ORDER — VALACYCLOVIR HCL 1 G PO TABS: 1000.0000 mg | ORAL_TABLET | Freq: Two times a day (BID) | ORAL | Status: DC

## 2012-10-14 NOTE — Progress Notes (Signed)
  Subjective:    Patient ID: Justin Gill, male    DOB: 21-Nov-1955, 57 y.o.   MRN: [id number]  HPI Concerned about positive HSV result   Review of Systems Tingling sensation when urinating    Objective:   Physical Exam HSV I & II positive RPR negative Urine GC negative HIV negative  Anxious and depressed        Assessment & Plan:  HSV I & II  Meds ordered this encounter  Medications  . DISCONTD: valACYclovir (VALTREX) 1000 MG tablet    Sig: Take 1 tablet (1,000 mg total) by mouth 2 (two) times daily.    Dispense:  20 tablet    Refill:  0    Order Specific Question:  Supervising Provider    Answer:  Ernestina Penna [[id number]]  . valACYclovir (VALTREX) 1000 MG tablet    Sig: Take 1 tablet (1,000 mg total) by mouth daily.    Dispense:  30 tablet    Refill:  3    Order Specific Question:  Supervising Provider    Answer:  Ernestina Penna [phone]

## 2012-12-25 ENCOUNTER — Ambulatory Visit (INDEPENDENT_AMBULATORY_CARE_PROVIDER_SITE_OTHER): Payer: PRIVATE HEALTH INSURANCE | Admitting: Nurse Practitioner

## 2012-12-25 VITALS — BP 151/93 | HR 89 | Temp 97.6°F | Ht 71.0 in | Wt 181.0 lb

## 2012-12-25 DIAGNOSIS — J441 Chronic obstructive pulmonary disease with (acute) exacerbation: Secondary | ICD-10-CM

## 2012-12-25 DIAGNOSIS — I10 Essential (primary) hypertension: Secondary | ICD-10-CM

## 2012-12-25 DIAGNOSIS — L259 Unspecified contact dermatitis, unspecified cause: Secondary | ICD-10-CM

## 2012-12-25 MED ORDER — ALBUTEROL SULFATE HFA 108 (90 BASE) MCG/ACT IN AERS: 2.0000 | INHALATION_SPRAY | Freq: Four times a day (QID) | RESPIRATORY_TRACT | Status: DC | PRN

## 2012-12-25 MED ORDER — METHYLPREDNISOLONE ACETATE 80 MG/ML IJ SUSP
80.0000 mg | Freq: Once | INTRAMUSCULAR | Status: AC
  Administered 2012-12-25: 80 mg via INTRAMUSCULAR

## 2012-12-25 MED ORDER — EPINEPHRINE HCL 1 MG/ML IJ SOLN
1.0000 mg | Freq: Once | INTRAMUSCULAR | Status: AC
  Administered 2012-12-25: 1 mg via INTRAMUSCULAR

## 2012-12-25 MED ORDER — AMLODIPINE BESYLATE 5 MG PO TABS: 5.0000 mg | ORAL_TABLET | Freq: Every day | ORAL | Status: DC

## 2012-12-25 MED ORDER — PREDNISONE 20 MG PO TABS: ORAL_TABLET | ORAL | Status: DC

## 2012-12-25 MED ORDER — MOMETASONE FURO-FORMOTEROL FUM 200-5 MCG/ACT IN AERO: 2.0000 | INHALATION_SPRAY | Freq: Every day | RESPIRATORY_TRACT | Status: DC

## 2012-12-25 NOTE — Patient Instructions (Signed)
Poison Oak Poison oak is an inflammation of the skin (contact dermatitis). It is caused by contact with the allergens on the leaves of the oak (toxicodendron) plants. Depending on your sensitivity, the rash may consist simply of redness and itching, or it may also progress to blisters which may break open (rupture). These must be well cared for to prevent secondary germ (bacterial) infection as these infections can lead to scarring. The eyes may also get puffy. The puffiness is worst in the morning and gets better as the day progresses. Healing is best accomplished by keeping any open areas dry, clean, covered with a bandage, and covered with an antibacterial ointment if needed. Without secondary infection, this dermatitis usually heals without scarring within 2 to 3 weeks without treatment. HOME CARE INSTRUCTIONS When you have been exposed to poison oak, it is very important to thoroughly wash with soap and water as soon as the exposure has been discovered. You have about one half hour to remove the plant resin before it will cause the rash. This cleaning will quickly destroy the oil or antigen on the skin (the antigen is what causes the rash). Wash aggressively under the fingernails as any plant resin still there will continue to spread the rash. Do not rub skin vigorously when washing affected area. Poison oak cannot spread if no oil from the plant remains on your body. Rash that has progressed to weeping sores (lesions) will not spread the rash unless you have not washed thoroughly. It is also important to clean any clothes you have been wearing as they may carry active allergens which will spread the rash, even several days later. Avoidance of the plant in the future is the best measure. Poison oak plants can be recognized by the number of leaves. Generally, poison oak has three leaves with flowering branches on a single stem. Diphenhydramine may be purchased over the counter and used as needed for  itching. Do not drive with this medication if it makes you drowsy. Ask your caregiver about medication for children. SEEK IMMEDIATE MEDICAL CARE IF:   Open areas of the rash develop.  You notice redness extending beyond the area of the rash.  There is a pus like discharge.  There is increased pain.  Other signs of infection develop (such as fever). Document Released: 01/18/2003 Document Revised: 10/06/2011 Document Reviewed: 05/30/2009 Exit[employer] Patient Information 2014 [employer].  

## 2012-12-25 NOTE — Progress Notes (Signed)
  Subjective:    Patient ID: Justin Gill, male    DOB: 11/04/1955, 57 y.o.   MRN: [id number]  HPI  Patient in c/o getting stung by a bee between eyes on Wednesday afternoon- Was also working out in the weeds the same day- His eyes have gradually started swelling and he also has a rash on his forearms.- Patient thinks having allergic reaction to bee sting.    Review of Systems  Eyes:       Eyes swollen  Respiratory: Negative for shortness of breath.   Cardiovascular: Negative for chest pain, palpitations and leg swelling.  Skin: Positive for rash.       Objective:   Physical Exam  Eyes: Conjunctivae and EOM are normal. Pupils are equal, round, and reactive to light.  superficial periorbital edema with erythema  Cardiovascular: Normal rate and normal heart sounds.   Pulmonary/Chest: Effort normal.  Skin:  Erythematous vesicular lesions in linear pattern bil wrists.    BP 151/93  Pulse 89  Temp(Src) 97.6 F (36.4 C) (Oral)  Ht 5\' 11"  (1.803 m)  Wt 181 lb (82.101 kg)  BMI 25.26 kg/m2       Assessment & Plan:  1. Contact dermatitis Avoid scratching Cool compresses - EPINEPHrine (ADRENALIN) injection 1 mg; Inject 1 mL (1 mg total) into the muscle once. - methylPREDNISolone acetate (DEPO-MEDROL) injection 80 mg; Inject 1 mL (80 mg total) into the muscle once. - predniSONE (DELTASONE) 20 MG tablet; 2 tablets at same time daily for 5 days- Start tomorrow  Dispense: 10 tablet; Refill: 0  2. Hypertension LOw NA+ diet - amLODipine (NORVASC) 5 MG tablet; Take 1 tablet (5 mg total) by mouth daily.  Dispense: 30 tablet; Refill: 5  3. COPD exacerbation  - albuterol (PROVENTIL HFA;VENTOLIN HFA) 108 (90 BASE) MCG/ACT inhaler; Inhale 2 puffs into the lungs every 6 (six) hours as needed for wheezing.  Dispense: 1 Inhaler; Refill: 1 - mometasone-formoterol (DULERA) 200-5 MCG/ACT AERO; Inhale 2 puffs into the lungs daily.  Dispense: 1 Inhaler; Refill: 2   Mary-Margaret Daphine Deutscher,  FNP

## 2013-06-07 ENCOUNTER — Other Ambulatory Visit: Payer: Self-pay

## 2013-06-07 DIAGNOSIS — J441 Chronic obstructive pulmonary disease with (acute) exacerbation: Secondary | ICD-10-CM

## 2013-06-07 MED ORDER — ALBUTEROL SULFATE HFA 108 (90 BASE) MCG/ACT IN AERS: 2.0000 | INHALATION_SPRAY | Freq: Four times a day (QID) | RESPIRATORY_TRACT | Status: DC | PRN

## 2014-05-11 ENCOUNTER — Ambulatory Visit (INDEPENDENT_AMBULATORY_CARE_PROVIDER_SITE_OTHER): Payer: 59 | Admitting: Family Medicine

## 2014-05-11 ENCOUNTER — Encounter: Payer: Self-pay | Admitting: Family Medicine

## 2014-05-11 VITALS — BP 150/92 | HR 80 | Temp 98.4°F | Ht 71.0 in | Wt 181.6 lb

## 2014-05-11 DIAGNOSIS — I10 Essential (primary) hypertension: Secondary | ICD-10-CM

## 2014-05-11 DIAGNOSIS — J441 Chronic obstructive pulmonary disease with (acute) exacerbation: Secondary | ICD-10-CM

## 2014-05-11 DIAGNOSIS — B009 Herpesviral infection, unspecified: Secondary | ICD-10-CM

## 2014-05-11 DIAGNOSIS — N522 Drug-induced erectile dysfunction: Secondary | ICD-10-CM

## 2014-05-11 MED ORDER — SILDENAFIL CITRATE 100 MG PO TABS: 50.0000 mg | ORAL_TABLET | Freq: Every day | ORAL | Status: DC | PRN

## 2014-05-11 MED ORDER — MOMETASONE FURO-FORMOTEROL FUM 200-5 MCG/ACT IN AERO: 2.0000 | INHALATION_SPRAY | Freq: Every day | RESPIRATORY_TRACT | Status: DC

## 2014-05-11 MED ORDER — BENZONATATE 100 MG PO CAPS: 100.0000 mg | ORAL_CAPSULE | Freq: Three times a day (TID) | ORAL | Status: DC | PRN

## 2014-05-11 MED ORDER — ALBUTEROL SULFATE HFA 108 (90 BASE) MCG/ACT IN AERS: 2.0000 | INHALATION_SPRAY | Freq: Four times a day (QID) | RESPIRATORY_TRACT | Status: DC | PRN

## 2014-05-11 MED ORDER — METHYLPREDNISOLONE (PAK) 4 MG PO TABS: ORAL_TABLET | ORAL | Status: DC

## 2014-05-11 MED ORDER — AMLODIPINE BESYLATE 5 MG PO TABS: 5.0000 mg | ORAL_TABLET | Freq: Every day | ORAL | Status: DC

## 2014-05-11 MED ORDER — VALACYCLOVIR HCL 1 G PO TABS
1000.0000 mg | ORAL_TABLET | Freq: Every day | ORAL | Status: DC
Start: 2014-05-11 — End: 2015-04-19

## 2014-05-11 MED ORDER — AMOXICILLIN 875 MG PO TABS: 875.0000 mg | ORAL_TABLET | Freq: Two times a day (BID) | ORAL | Status: DC

## 2014-05-11 NOTE — Progress Notes (Signed)
   Subjective:    Patient ID: Justin Gill, male    DOB: Jan 23, 1956, 58 y.o.   MRN: [id number]  HPI This 58 y.o. male presents for evaluation of URI and cough.  He needs refills on meds. He smokes.  He has been having exacerbation sx's and has hx of copd.   Review of Systems C/o cough and URI sx's   No chest pain, SOB, HA, dizziness, vision change, N/V, diarrhea, constipation, dysuria, urinary urgency or frequency, myalgias, arthralgias or rash.  Objective:   Physical Exam Vital signs noted  Well developed well nourished male.  HEENT - Head atraumatic Normocephalic                Eyes - PERRLA, Conjuctiva - clear Sclera- Clear EOMI                Ears - EAC's Wnl TM's Wnl Gross Hearing WNL                Nose - Nares patent                 Throat - oropharanx wnl Respiratory - Lungs CTA bilateral Cardiac - RRR S1 and S2 without murmur GI - Abdomen soft Nontender and bowel sounds active x 4 Extremities - No edema. Neuro - Grossly intact.       Assessment & Plan:  COPD exacerbation - Plan: mometasone-formoterol (DULERA) 200-5 MCG/ACT AERO, albuterol (PROVENTIL HFA;VENTOLIN HFA) 108 (90 BASE) MCG/ACT inhaler, methylPREDNIsolone (MEDROL DOSPACK) 4 MG tablet, amoxicillin (AMOXIL) 875 MG tablet, benzonatate (TESSALON PERLES) 100 MG capsule  HSV-2 (herpes simplex virus 2) infection - Plan: valACYclovir (VALTREX) 1000 MG tablet  Essential hypertension - Plan: amLODipine (NORVASC) 5 MG tablet  Drug-induced erectile dysfunction - Plan: sildenafil (VIAGRA) 100 MG tablet  Lysbeth Penner FNP

## 2014-07-12 ENCOUNTER — Ambulatory Visit (INDEPENDENT_AMBULATORY_CARE_PROVIDER_SITE_OTHER): Payer: 59 | Admitting: Family Medicine

## 2014-07-12 VITALS — BP 137/86 | HR 83 | Temp 97.9°F | Ht 71.0 in | Wt 180.0 lb

## 2014-07-12 DIAGNOSIS — R5383 Other fatigue: Secondary | ICD-10-CM

## 2014-07-12 DIAGNOSIS — I10 Essential (primary) hypertension: Secondary | ICD-10-CM

## 2014-07-12 DIAGNOSIS — Z0000 Encounter for general adult medical examination without abnormal findings: Secondary | ICD-10-CM

## 2014-07-12 DIAGNOSIS — Z139 Encounter for screening, unspecified: Secondary | ICD-10-CM

## 2014-07-12 DIAGNOSIS — E785 Hyperlipidemia, unspecified: Secondary | ICD-10-CM

## 2014-07-12 LAB — POCT CBC
Granulocyte percent: 52.4 %G (ref 37–80)
HCT, POC: 47.5 % (ref 43.5–53.7)
Hemoglobin: 16.1 g/dL (ref 14.1–18.1)
Lymph, poc: 1.9 (ref 0.6–3.4)
MCH, POC: 32.3 pg — AB (ref 27–31.2)
MCHC: 34 g/dL (ref 31.8–35.4)
MCV: 95.2 fL (ref 80–97)
MPV: 7.2 fL (ref 0–99.8)
POC Granulocyte: 2.3 (ref 2–6.9)
POC LYMPH PERCENT: 42.2 %L (ref 10–50)
Platelet Count, POC: 258 10*3/uL (ref 142–424)
RBC: 5 M/uL (ref 4.69–6.13)
RDW, POC: 13.8 %
WBC: 4.4 10*3/uL — AB (ref 4.6–10.2)

## 2014-07-12 NOTE — Progress Notes (Signed)
   Subjective:    Patient ID: Justin Gill, male    DOB: July 09, 1956, 58 y.o.   MRN: [id number]  HPI Patient is here for CPE. He has no acute complaints.  Review of Systems  Constitutional: Negative for fever.  HENT: Negative for ear pain.   Eyes: Negative for discharge.  Respiratory: Negative for cough.   Cardiovascular: Negative for chest pain.  Gastrointestinal: Negative for abdominal distention.  Endocrine: Negative for polyuria.  Genitourinary: Negative for difficulty urinating.  Musculoskeletal: Negative for gait problem and neck pain.  Skin: Negative for color change and rash.  Neurological: Negative for speech difficulty and headaches.  Psychiatric/Behavioral: Negative for agitation.       Objective:    BP 137/86 mmHg  Pulse 83  Temp(Src) 97.9 F (36.6 C) (Oral)  Ht _0  (1.803 m)  Wt 180 lb (81.647 kg)  BMI 25.12 kg/m2 Physical Exam  Constitutional: He is oriented to person, place, and time. He appears well-developed and well-nourished.  HENT:  Head: Normocephalic and atraumatic.  Mouth/Throat: Oropharynx is clear and moist.  Eyes: Pupils are equal, round, and reactive to light.  Neck: Normal range of motion. Neck supple.  Cardiovascular: Normal rate and regular rhythm.   No murmur heard. Pulmonary/Chest: Effort normal and breath sounds normal.  Abdominal: Soft. Bowel sounds are normal. There is no tenderness.  Neurological: He is alert and oriented to person, place, and time.  Skin: Skin is warm and dry.  Psychiatric: He has a normal mood and affect.          Assessment & Plan:     ICD-9-CM ICD-10-CM   1. Other fatigue 780.79 R53.83 POCT CBC     CMP14+EGFR     Thyroid Panel With TSH  2. Essential hypertension, benign 401.1 I10 CMP14+EGFR  3. Hyperlipemia 272.4 E78.5 CMP14+EGFR     Lipid panel  4. Screening V82.9 Z13.9 PSA, total and free     No Follow-up on file.  Lysbeth Penner FNP

## 2014-07-13 LAB — CMP14+EGFR
ALT: 25 IU/L (ref 0–44)
AST: 27 IU/L (ref 0–40)
Albumin/Globulin Ratio: 2 (ref 1.1–2.5)
Albumin: 4.7 g/dL (ref 3.5–5.5)
Alkaline Phosphatase: 58 IU/L (ref 39–117)
BUN/Creatinine Ratio: 10 (ref 9–20)
BUN: 10 mg/dL (ref 6–24)
CO2: 25 mmol/L (ref 18–29)
Calcium: 9.2 mg/dL (ref 8.7–10.2)
Chloride: 103 mmol/L (ref 97–108)
Creatinine, Ser: 0.97 mg/dL (ref 0.76–1.27)
GFR calc Af Amer: 98 mL/min/{1.73_m2} (ref >59)
GFR calc non Af Amer: 85 mL/min/{1.73_m2} (ref >59)
Globulin, Total: 2.3 g/dL (ref 1.5–4.5)
Glucose: 96 mg/dL (ref 65–99)
Potassium: 4.3 mmol/L (ref 3.5–5.2)
Sodium: 142 mmol/L (ref 134–144)
Total Bilirubin: 0.7 mg/dL (ref 0.0–1.2)
Total Protein: 7 g/dL (ref 6.0–8.5)

## 2014-07-13 LAB — THYROID PANEL WITH TSH
Free Thyroxine Index: 3 (ref 1.2–4.9)
T3 Uptake Ratio: 33 % (ref 24–39)
T4, Total: 9.1 ug/dL (ref 4.5–12.0)
TSH: 1.06 u[IU]/mL (ref 0.450–4.500)

## 2014-07-13 LAB — LIPID PANEL
Chol/HDL Ratio: 2.2 ratio units (ref 0.0–5.0)
Cholesterol, Total: 163 mg/dL (ref 100–199)
HDL: 73 mg/dL (ref >39)
LDL Calculated: 78 mg/dL (ref 0–99)
Triglycerides: 62 mg/dL (ref 0–149)
VLDL Cholesterol Cal: 12 mg/dL (ref 5–40)

## 2014-07-13 LAB — PSA, TOTAL AND FREE
PSA, Free Pct: 33.8 %
PSA, Free: 0.27 ng/mL
PSA: 0.8 ng/mL (ref 0.0–4.0)

## 2014-07-14 ENCOUNTER — Telehealth: Payer: Self-pay | Admitting: Family Medicine

## 2014-07-14 NOTE — Telephone Encounter (Signed)
-----   Message from Lysbeth Penner, FNP sent at 07/14/2014 12:16 PM EST ----- Labs look good

## 2014-07-17 ENCOUNTER — Telehealth: Payer: Self-pay

## 2014-07-17 NOTE — Telephone Encounter (Signed)
Letter sent with results

## 2014-07-17 NOTE — Telephone Encounter (Signed)
Pt aware of lab results 

## 2014-07-17 NOTE — Telephone Encounter (Signed)
-----   Message from Lysbeth Penner, FNP sent at 07/14/2014 12:16 PM EST ----- Labs look good

## 2015-04-19 ENCOUNTER — Ambulatory Visit (INDEPENDENT_AMBULATORY_CARE_PROVIDER_SITE_OTHER): Payer: BLUE CROSS/BLUE SHIELD | Admitting: Family Medicine

## 2015-04-19 ENCOUNTER — Encounter: Payer: Self-pay | Admitting: Family Medicine

## 2015-04-19 ENCOUNTER — Encounter (INDEPENDENT_AMBULATORY_CARE_PROVIDER_SITE_OTHER): Payer: Self-pay

## 2015-04-19 VITALS — BP 157/97 | HR 86 | Temp 98.2°F | Ht 71.0 in | Wt 190.2 lb

## 2015-04-19 DIAGNOSIS — J441 Chronic obstructive pulmonary disease with (acute) exacerbation: Secondary | ICD-10-CM | POA: Diagnosis not present

## 2015-04-19 DIAGNOSIS — N522 Drug-induced erectile dysfunction: Secondary | ICD-10-CM

## 2015-04-19 DIAGNOSIS — J449 Chronic obstructive pulmonary disease, unspecified: Secondary | ICD-10-CM | POA: Diagnosis not present

## 2015-04-19 DIAGNOSIS — B009 Herpesviral infection, unspecified: Secondary | ICD-10-CM

## 2015-04-19 DIAGNOSIS — I10 Essential (primary) hypertension: Secondary | ICD-10-CM | POA: Diagnosis not present

## 2015-04-19 DIAGNOSIS — Z720 Tobacco use: Secondary | ICD-10-CM

## 2015-04-19 DIAGNOSIS — F17200 Nicotine dependence, unspecified, uncomplicated: Secondary | ICD-10-CM | POA: Insufficient documentation

## 2015-04-19 DIAGNOSIS — Z0000 Encounter for general adult medical examination without abnormal findings: Secondary | ICD-10-CM | POA: Insufficient documentation

## 2015-04-19 DIAGNOSIS — N529 Male erectile dysfunction, unspecified: Secondary | ICD-10-CM | POA: Insufficient documentation

## 2015-04-19 MED ORDER — VALACYCLOVIR HCL 1 G PO TABS: 1000.0000 mg | ORAL_TABLET | Freq: Every day | ORAL | Status: DC

## 2015-04-19 MED ORDER — MOMETASONE FURO-FORMOTEROL FUM 200-5 MCG/ACT IN AERO: 2.0000 | INHALATION_SPRAY | Freq: Two times a day (BID) | RESPIRATORY_TRACT | Status: DC

## 2015-04-19 MED ORDER — ALBUTEROL SULFATE HFA 108 (90 BASE) MCG/ACT IN AERS: 2.0000 | INHALATION_SPRAY | Freq: Four times a day (QID) | RESPIRATORY_TRACT | Status: DC | PRN

## 2015-04-19 MED ORDER — SILDENAFIL CITRATE 100 MG PO TABS: 50.0000 mg | ORAL_TABLET | Freq: Every day | ORAL | Status: DC | PRN

## 2015-04-19 MED ORDER — AMLODIPINE BESYLATE 5 MG PO TABS: 5.0000 mg | ORAL_TABLET | Freq: Every day | ORAL | Status: DC

## 2015-04-19 NOTE — Progress Notes (Signed)
   HPI  Patient presents today for medication refill and review of chronic medical problems  Hypertension Not checking blood pressure at home, and now out of medications for over a week. Tolerating amlodipine easily Tries to cut back on salt Rides amount by several days a week.  COPD Takes Dulera once daily, he was instructed to take it this way Uses albuterol twice daily but without symptoms, using prophylactically Smoking still, not very interested in quitting  Erectile dysfunction Trouble maintaining an erection, gets good help from Viagra.  HSV-2 infection Managed well with Valtrex.  He requests PSA today and refuses a DRE. He plans on coming back in 2 months for a full physical.  PMH: Smoking status noted ROS: Per HPI  Objective: BP 157/97 mmHg  Pulse 86  Temp(Src) 98.2 F (36.8 C) (Oral)  Ht $R'5\' 11"'ND$  (1.803 m)  Wt 190 lb 3.2 oz (86.274 kg)  BMI 26.54 kg/m2 Gen: NAD, alert, cooperative with exam HEENT: NCAT, oropharynx clear, TMs WNL BL Neck: Trachea midline, no thyromegaly CV: RRR, good S1/S2, no murmur Resp: CTABL, no wheezes, non-labored Abd: SNTND, BS present, no guarding or organomegaly Ext: No edema, warm Neuro: Alert and oriented, No gross deficits  Assessment and plan:  # Hypertension Elevated, continue amlodipine, currently out of medication Labs  # Erectile dysfunction Refill sildenafil  # Tobacco abuse Counseled  # COPD Recommended using albuterol only as needed, then consider increasing controller medications  Care maintenance Refuses colonoscopy -  agreed to continue discussion at his physical in 2 months    Orders Placed This Encounter  Procedures  . CMP14+EGFR  . CBC with Differential  . Hepatitis C antibody  . Lipid Panel  . PSA  . HIV antibody    Meds ordered this encounter  Medications  . sildenafil (VIAGRA) 100 MG tablet    Sig: Take 0.5-1 tablets (50-100 mg total) by mouth daily as needed for erectile dysfunction.    Dispense:  20 tablet    Refill:  11  . mometasone-formoterol (DULERA) 200-5 MCG/ACT AERO    Sig: Inhale 2 puffs into the lungs 2 (two) times daily.    Dispense:  1 Inhaler    Refill:  11  . albuterol (PROVENTIL HFA;VENTOLIN HFA) 108 (90 BASE) MCG/ACT inhaler    Sig: Inhale 2 puffs into the lungs every 6 (six) hours as needed for wheezing.    Dispense:  1 Inhaler    Refill:  2  . valACYclovir (VALTREX) 1000 MG tablet    Sig: Take 1 tablet (1,000 mg total) by mouth daily.    Dispense:  30 tablet    Refill:  11  . amLODipine (NORVASC) 5 MG tablet    Sig: Take 1 tablet (5 mg total) by mouth daily.    Dispense:  30 tablet    Refill:  [place], MD [facility] 04/19/2015, 8:34 AM

## 2015-04-19 NOTE — Addendum Note (Signed)
Addended by: Timmothy Euler on: 04/19/2015 08:46 AM   Modules accepted: Orders

## 2015-04-19 NOTE — Patient Instructions (Signed)
Great to meet you!  Come back in 2-3 months to re-check your blood pressure and for your physical  The best thing you can do for your health is stop smoking

## 2015-04-20 LAB — CMP14+EGFR
ALK PHOS: 58 IU/L (ref 39–117)
ALT: 33 IU/L (ref 0–44)
AST: 25 IU/L (ref 0–40)
Albumin/Globulin Ratio: 1.6 (ref 1.1–2.5)
Albumin: 4.4 g/dL (ref 3.6–4.8)
BUN/Creatinine Ratio: 10 (ref 10–22)
BUN: 10 mg/dL (ref 8–27)
Bilirubin Total: 0.6 mg/dL (ref 0.0–1.2)
CALCIUM: 9.6 mg/dL (ref 8.6–10.2)
CO2: 21 mmol/L (ref 18–29)
CREATININE: 0.97 mg/dL (ref 0.76–1.27)
Chloride: 102 mmol/L (ref 97–108)
GFR calc Af Amer: 98 mL/min/{1.73_m2} (ref >59)
GFR, EST NON AFRICAN AMERICAN: 84 mL/min/{1.73_m2} (ref >59)
GLOBULIN, TOTAL: 2.8 g/dL (ref 1.5–4.5)
GLUCOSE: 105 mg/dL — AB (ref 65–99)
Potassium: 4.5 mmol/L (ref 3.5–5.2)
SODIUM: 142 mmol/L (ref 134–144)
Total Protein: 7.2 g/dL (ref 6.0–8.5)

## 2015-04-20 LAB — CBC WITH DIFFERENTIAL/PLATELET
Basophils Absolute: 0 10*3/uL (ref 0.0–0.2)
Basos: 1 %
EOS (ABSOLUTE): 0.3 10*3/uL (ref 0.0–0.4)
EOS: 6 %
HEMATOCRIT: 44.1 % (ref 37.5–51.0)
Hemoglobin: 15.9 g/dL (ref 12.6–17.7)
IMMATURE GRANS (ABS): 0 10*3/uL (ref 0.0–0.1)
IMMATURE GRANULOCYTES: 0 %
LYMPHS: 43 %
Lymphocytes Absolute: 2 10*3/uL (ref 0.7–3.1)
MCH: 33.5 pg — ABNORMAL HIGH (ref 26.6–33.0)
MCHC: 36.1 g/dL — ABNORMAL HIGH (ref 31.5–35.7)
MCV: 93 fL (ref 79–97)
MONOS ABS: 0.5 10*3/uL (ref 0.1–0.9)
Monocytes: 10 %
NEUTROS PCT: 40 %
Neutrophils Absolute: 1.9 10*3/uL (ref 1.4–7.0)
PLATELETS: 279 10*3/uL (ref 150–379)
RBC: 4.74 x10E6/uL (ref 4.14–5.80)
RDW: 13.7 % (ref 12.3–15.4)
WBC: 4.6 10*3/uL (ref 3.4–10.8)

## 2015-04-20 LAB — HIV ANTIBODY (ROUTINE TESTING W REFLEX): HIV Screen 4th Generation wRfx: NONREACTIVE

## 2015-04-20 LAB — LIPID PANEL
CHOLESTEROL TOTAL: 170 mg/dL (ref 100–199)
Chol/HDL Ratio: 2.8 ratio units (ref 0.0–5.0)
HDL: 61 mg/dL (ref >39)
LDL Calculated: 91 mg/dL (ref 0–99)
TRIGLYCERIDES: 89 mg/dL (ref 0–149)
VLDL Cholesterol Cal: 18 mg/dL (ref 5–40)

## 2015-04-20 LAB — HEPATITIS C ANTIBODY: Hep C Virus Ab: <0.1 s/co ratio (ref 0.0–0.9)

## 2015-04-20 LAB — PSA: Prostate Specific Ag, Serum: 0.6 ng/mL (ref 0.0–4.0)

## 2015-09-06 ENCOUNTER — Other Ambulatory Visit: Payer: Self-pay

## 2015-09-06 DIAGNOSIS — J441 Chronic obstructive pulmonary disease with (acute) exacerbation: Secondary | ICD-10-CM

## 2015-09-06 MED ORDER — ALBUTEROL SULFATE HFA 108 (90 BASE) MCG/ACT IN AERS: 2.0000 | INHALATION_SPRAY | Freq: Four times a day (QID) | RESPIRATORY_TRACT | Status: DC | PRN

## 2015-09-07 ENCOUNTER — Other Ambulatory Visit: Payer: Self-pay

## 2015-09-07 DIAGNOSIS — J441 Chronic obstructive pulmonary disease with (acute) exacerbation: Secondary | ICD-10-CM

## 2015-09-07 MED ORDER — ALBUTEROL SULFATE HFA 108 (90 BASE) MCG/ACT IN AERS: 2.0000 | INHALATION_SPRAY | Freq: Four times a day (QID) | RESPIRATORY_TRACT | Status: DC | PRN

## 2015-09-11 ENCOUNTER — Other Ambulatory Visit: Payer: Self-pay | Admitting: *Deleted

## 2015-09-11 DIAGNOSIS — J441 Chronic obstructive pulmonary disease with (acute) exacerbation: Secondary | ICD-10-CM

## 2015-09-11 MED ORDER — ALBUTEROL SULFATE HFA 108 (90 BASE) MCG/ACT IN AERS: 2.0000 | INHALATION_SPRAY | Freq: Four times a day (QID) | RESPIRATORY_TRACT | Status: DC | PRN

## 2016-05-01 ENCOUNTER — Ambulatory Visit (INDEPENDENT_AMBULATORY_CARE_PROVIDER_SITE_OTHER): Payer: BLUE CROSS/BLUE SHIELD | Admitting: Family Medicine

## 2016-05-01 ENCOUNTER — Encounter: Payer: Self-pay | Admitting: Family Medicine

## 2016-05-01 VITALS — BP 169/103 | HR 90 | Temp 97.5°F | Ht 71.0 in | Wt 184.6 lb

## 2016-05-01 DIAGNOSIS — M542 Cervicalgia: Secondary | ICD-10-CM

## 2016-05-01 DIAGNOSIS — B009 Herpesviral infection, unspecified: Secondary | ICD-10-CM

## 2016-05-01 DIAGNOSIS — J449 Chronic obstructive pulmonary disease, unspecified: Secondary | ICD-10-CM | POA: Diagnosis not present

## 2016-05-01 DIAGNOSIS — N522 Drug-induced erectile dysfunction: Secondary | ICD-10-CM

## 2016-05-01 DIAGNOSIS — Z0000 Encounter for general adult medical examination without abnormal findings: Secondary | ICD-10-CM

## 2016-05-01 DIAGNOSIS — I10 Essential (primary) hypertension: Secondary | ICD-10-CM | POA: Diagnosis not present

## 2016-05-01 MED ORDER — AMLODIPINE BESYLATE 5 MG PO TABS: 5.0000 mg | ORAL_TABLET | Freq: Every day | ORAL | 11 refills | Status: DC

## 2016-05-01 MED ORDER — VALACYCLOVIR HCL 1 G PO TABS: 1000.0000 mg | ORAL_TABLET | Freq: Every day | ORAL | 11 refills | Status: DC

## 2016-05-01 MED ORDER — ALBUTEROL SULFATE HFA 108 (90 BASE) MCG/ACT IN AERS: 2.0000 | INHALATION_SPRAY | Freq: Four times a day (QID) | RESPIRATORY_TRACT | 2 refills | Status: DC | PRN

## 2016-05-01 MED ORDER — MOMETASONE FURO-FORMOTEROL FUM 200-5 MCG/ACT IN AERO: 2.0000 | INHALATION_SPRAY | Freq: Two times a day (BID) | RESPIRATORY_TRACT | 11 refills | Status: DC

## 2016-05-01 MED ORDER — SILDENAFIL CITRATE 100 MG PO TABS: 50.0000 mg | ORAL_TABLET | Freq: Every day | ORAL | 11 refills | Status: DC | PRN

## 2016-05-01 MED ORDER — CYCLOBENZAPRINE HCL 10 MG PO TABS: 10.0000 mg | ORAL_TABLET | Freq: Three times a day (TID) | ORAL | 0 refills | Status: DC | PRN

## 2016-05-01 MED ORDER — PREDNISONE 20 MG PO TABS: 40.0000 mg | ORAL_TABLET | Freq: Every day | ORAL | 0 refills | Status: DC

## 2016-05-01 NOTE — Progress Notes (Signed)
HPI  Patient presents today for follow-up of chronic medical conditions and neck pain.  Neck pain 7-10 days right-sided neck pain with some radiation of numbness type pain down his posterior right arm No arm weakness No alleviating factors, aggravated by turning his head to the right or laying on his right arm.  Hypertension Usually measures normal at the store, [id number] systolic No chest pain, dyspnea, palpitations, leg edema Occupationally active.  COPD Still smoking, not interested in quitting Good medication compliance Using albuterol less than 2 times a week.  HSV-2 controlled with Valtrex.  Needs refills.    PMH: Smoking status noted ROS: Per HPI  Objective: BP (!) (P) 150/89   Pulse 90   Temp 97.5 F (36.4 C) (Oral)   Ht 5\' 11"  (1.803 m)   Wt 184 lb 9.6 oz (83.7 kg)   BMI 25.75 kg/m  Gen: NAD, alert, cooperative with exam HEENT: NCAT, EOMI, PERRL CV: RRR, good S1/S2, no murmur Resp: CTABL, no wheezes, non-labored Abd: SNTND, BS present, no guarding or organomegaly Ext: No edema, warm Neuro: Alert and oriented, strength 5/5 and sensation intact bilateral upper extremities, triceps tendon reflex intact  Musculoskeletal Spasm of the right neck Mild tenderness to palpation in paraspinal muscles of the cervical spine    Assessment and plan:  # Neck pain Likely muscle spasm, however some concern for radiculopathy with arm numbness Prednisone, Flexeril  # COPD Stable, refilled inhalers Controlled, recommended stopping smoking  # Hypertension Elevated, however concern about whitecoat hypertension Continue 5 mg amlodipine, follow-up for nurse visit for blood pressure check  # Preventative health care GI referral written for colonoscopy     Orders Placed This Encounter  Procedures  . Ambulatory referral to Gastroenterology    Referral Priority:   Routine    Referral Type:   Consultation    Referral Reason:   Specialty Services Required   Number of Visits Requested:   1    Meds ordered this encounter  Medications  . albuterol (PROVENTIL HFA;VENTOLIN HFA) 108 (90 Base) MCG/ACT inhaler    Sig: Inhale 2 puffs into the lungs every 6 (six) hours as needed for wheezing.    Dispense:  1 Inhaler    Refill:  2  . mometasone-formoterol (DULERA) 200-5 MCG/ACT AERO    Sig: Inhale 2 puffs into the lungs 2 (two) times daily.    Dispense:  1 Inhaler    Refill:  11  . amLODipine (NORVASC) 5 MG tablet    Sig: Take 1 tablet (5 mg total) by mouth daily.    Dispense:  30 tablet    Refill:  11  . sildenafil (VIAGRA) 100 MG tablet    Sig: Take 0.5-1 tablets (50-100 mg total) by mouth daily as needed for erectile dysfunction.    Dispense:  8 tablet    Refill:  11  . valACYclovir (VALTREX) 1000 MG tablet    Sig: Take 1 tablet (1,000 mg total) by mouth daily.    Dispense:  30 tablet    Refill:  11  . predniSONE (DELTASONE) 20 MG tablet    Sig: Take 2 tablets (40 mg total) by mouth daily with breakfast.    Dispense:  10 tablet    Refill:  0  . cyclobenzaprine (FLEXERIL) 10 MG tablet    Sig: Take 1 tablet (10 mg total) by mouth 3 (three) times daily as needed for muscle spasms.    Dispense:  30 tablet    Refill:  0  Laroy Apple, MD [facility] 05/01/2016, 9:08 AM

## 2016-05-01 NOTE — Patient Instructions (Signed)
Great to see you!  Come back for a nurse visit in a few weeks to get your blood pressure checked  You will get a  Call about your colonoscopy, don't be afraid to ask questions.   Take prednisone early in the day for neck pain, cyclobenzaprine for muscle relaxer at night.

## 2016-05-02 LAB — CMP14+EGFR
A/G RATIO: 1.6 (ref 1.2–2.2)
ALBUMIN: 4.7 g/dL (ref 3.6–4.8)
ALK PHOS: 75 IU/L (ref 39–117)
ALT: 34 IU/L (ref 0–44)
AST: 30 IU/L (ref 0–40)
BILIRUBIN TOTAL: 1 mg/dL (ref 0.0–1.2)
BUN / CREAT RATIO: 12 (ref 10–24)
BUN: 10 mg/dL (ref 8–27)
CO2: 22 mmol/L (ref 18–29)
Calcium: 9.5 mg/dL (ref 8.6–10.2)
Chloride: 104 mmol/L (ref 96–106)
Creatinine, Ser: 0.81 mg/dL (ref 0.76–1.27)
GFR calc Af Amer: 111 mL/min/{1.73_m2} (ref >59)
GFR calc non Af Amer: 96 mL/min/{1.73_m2} (ref >59)
GLOBULIN, TOTAL: 2.9 g/dL (ref 1.5–4.5)
Glucose: 89 mg/dL (ref 65–99)
POTASSIUM: 4 mmol/L (ref 3.5–5.2)
SODIUM: 143 mmol/L (ref 134–144)
Total Protein: 7.6 g/dL (ref 6.0–8.5)

## 2016-05-02 LAB — CBC WITH DIFFERENTIAL/PLATELET
Basophils Absolute: 0 10*3/uL (ref 0.0–0.2)
Basos: 1 %
EOS (ABSOLUTE): 0.2 10*3/uL (ref 0.0–0.4)
Eos: 4 %
HEMATOCRIT: 46.6 % (ref 37.5–51.0)
Hemoglobin: 16.4 g/dL (ref 12.6–17.7)
IMMATURE GRANULOCYTES: 1 %
Immature Grans (Abs): 0 10*3/uL (ref 0.0–0.1)
LYMPHS ABS: 1.5 10*3/uL (ref 0.7–3.1)
Lymphs: 35 %
MCH: 33.1 pg — ABNORMAL HIGH (ref 26.6–33.0)
MCHC: 35.2 g/dL (ref 31.5–35.7)
MCV: 94 fL (ref 79–97)
MONOS ABS: 0.5 10*3/uL (ref 0.1–0.9)
Monocytes: 11 %
NEUTROS PCT: 48 %
Neutrophils Absolute: 2.1 10*3/uL (ref 1.4–7.0)
PLATELETS: 295 10*3/uL (ref 150–379)
RBC: 4.96 x10E6/uL (ref 4.14–5.80)
RDW: 13.8 % (ref 12.3–15.4)
WBC: 4.3 10*3/uL (ref 3.4–10.8)

## 2016-05-04 LAB — LIPID PANEL
CHOLESTEROL TOTAL: 187 mg/dL (ref 100–199)
Chol/HDL Ratio: 2.7 ratio units (ref 0.0–5.0)
HDL: 69 mg/dL (ref >39)
LDL Calculated: 86 mg/dL (ref 0–99)
Triglycerides: 161 mg/dL — ABNORMAL HIGH (ref 0–149)
VLDL Cholesterol Cal: 32 mg/dL (ref 5–40)

## 2016-05-04 LAB — SPECIMEN STATUS REPORT

## 2017-03-13 ENCOUNTER — Ambulatory Visit (INDEPENDENT_AMBULATORY_CARE_PROVIDER_SITE_OTHER): Payer: BLUE CROSS/BLUE SHIELD | Admitting: Family Medicine

## 2017-03-13 ENCOUNTER — Encounter: Payer: Self-pay | Admitting: Family Medicine

## 2017-03-13 VITALS — BP 159/98 | HR 86 | Temp 99.3°F | Ht 71.0 in | Wt 170.8 lb

## 2017-03-13 DIAGNOSIS — Z720 Tobacco use: Secondary | ICD-10-CM | POA: Diagnosis not present

## 2017-03-13 DIAGNOSIS — L237 Allergic contact dermatitis due to plants, except food: Secondary | ICD-10-CM

## 2017-03-13 DIAGNOSIS — I10 Essential (primary) hypertension: Secondary | ICD-10-CM

## 2017-03-13 MED ORDER — CEPHALEXIN 500 MG PO CAPS: 500.0000 mg | ORAL_CAPSULE | Freq: Three times a day (TID) | ORAL | 0 refills | Status: DC

## 2017-03-13 MED ORDER — PREDNISONE 10 MG PO TABS
ORAL_TABLET | ORAL | 0 refills | Status: DC
Start: 2017-03-13 — End: 2017-04-29

## 2017-03-13 MED ORDER — TRIAMCINOLONE ACETONIDE 0.1 % EX CREA: 1.0000 "application " | TOPICAL_CREAM | Freq: Two times a day (BID) | CUTANEOUS | 1 refills | Status: DC

## 2017-03-13 NOTE — Progress Notes (Signed)
   HPI  Patient presents today with concern for poison oak exposure.  Patient helped clean out some but has about 3 days ago and had an itchy red rash develop on bilateral arms after that.  He requests prednisone pills to help with his symptoms. He has severe itching, he also has left greater than right symptoms, his left forearm seems to be more swollen than his right arm. He's concerned about infection as well.  His blood pressure is not been checked frequently at work recently, he denies headache or chest pain. He has good medication compliance.  He is a smoker, he is considering quitting  PMH: Smoking status noted ROS: Per HPI  Objective: BP (!) 159/98   Pulse 86   Temp 99.3 F (37.4 C) (Oral)   Ht 5\' 11"  (1.803 m)   Wt 170 lb 12.8 oz (77.5 kg)   BMI 23.82 kg/m  Gen: NAD, alert, cooperative with exam HEENT: NCAT CV: RRR, good S1/S2, no murmur Resp: CTABL, no wheezes, non-labored Ext: No edema, warm Neuro: Alert and oriented, No gross deficits  Skin:    Assessment and plan:  # Contact dermatitis Likely poison oak exposure Treatment three-week course of prednisone with severe symptoms He also has more erythema and warmth of the left arm compared to the right, have given him a short course of Keflex to ensure there is no underlying cellulitis Kenalog cream also  # Hypertension Elevated today, patient is taking medication but not checking at home He believes he has whitecoat hypertension, he can have this checked at work Will return in 3-4 weeks for annual exam  # Smoking, tobacco abuse Recommend quitting, he will consider, he is contemplative    Meds ordered this encounter  Medications  . predniSONE (DELTASONE) 10 MG tablet    Sig: Take 4 pills daily for 7 days, then 2 pills daily for 7 days, then 1 pill daily for 7 days then stop    Dispense:  49 tablet    Refill:  0  . cephALEXin (KEFLEX) 500 MG capsule    Sig: Take 1 capsule (500 mg total) by mouth 3  (three) times daily.    Dispense:  21 capsule    Refill:  0  . triamcinolone cream (KENALOG) 0.1 %    Sig: Apply 1 application topically 2 (two) times daily.    Dispense:  80 g    Refill:  [place], MD [facility] 03/13/2017, 4:10 PM

## 2017-03-13 NOTE — Patient Instructions (Signed)
Great to see you!  Take prednisone as prescribed Use the cream as needed avoiding the face, underarms, and groin  Finish all antibiotics

## 2017-04-07 ENCOUNTER — Other Ambulatory Visit: Payer: Self-pay | Admitting: Family Medicine

## 2017-04-15 ENCOUNTER — Other Ambulatory Visit: Payer: Self-pay | Admitting: Family Medicine

## 2017-04-18 ENCOUNTER — Other Ambulatory Visit: Payer: Self-pay | Admitting: Family Medicine

## 2017-04-18 DIAGNOSIS — N522 Drug-induced erectile dysfunction: Secondary | ICD-10-CM

## 2017-04-18 DIAGNOSIS — I10 Essential (primary) hypertension: Secondary | ICD-10-CM

## 2017-04-18 DIAGNOSIS — J449 Chronic obstructive pulmonary disease, unspecified: Secondary | ICD-10-CM

## 2017-04-24 ENCOUNTER — Ambulatory Visit: Payer: BLUE CROSS/BLUE SHIELD | Admitting: Family Medicine

## 2017-04-29 ENCOUNTER — Encounter: Payer: Self-pay | Admitting: Family Medicine

## 2017-04-29 ENCOUNTER — Other Ambulatory Visit: Payer: Self-pay | Admitting: Family Medicine

## 2017-04-29 ENCOUNTER — Ambulatory Visit (INDEPENDENT_AMBULATORY_CARE_PROVIDER_SITE_OTHER): Payer: BLUE CROSS/BLUE SHIELD | Admitting: Family Medicine

## 2017-04-29 ENCOUNTER — Ambulatory Visit (INDEPENDENT_AMBULATORY_CARE_PROVIDER_SITE_OTHER): Payer: BLUE CROSS/BLUE SHIELD

## 2017-04-29 VITALS — BP 143/88 | HR 76 | Temp 98.4°F | Ht 71.0 in | Wt 174.8 lb

## 2017-04-29 DIAGNOSIS — I10 Essential (primary) hypertension: Secondary | ICD-10-CM

## 2017-04-29 DIAGNOSIS — N529 Male erectile dysfunction, unspecified: Secondary | ICD-10-CM

## 2017-04-29 DIAGNOSIS — N522 Drug-induced erectile dysfunction: Secondary | ICD-10-CM

## 2017-04-29 DIAGNOSIS — M25562 Pain in left knee: Principal | ICD-10-CM

## 2017-04-29 DIAGNOSIS — Z0000 Encounter for general adult medical examination without abnormal findings: Secondary | ICD-10-CM

## 2017-04-29 DIAGNOSIS — M25561 Pain in right knee: Secondary | ICD-10-CM

## 2017-04-29 DIAGNOSIS — G8929 Other chronic pain: Secondary | ICD-10-CM

## 2017-04-29 DIAGNOSIS — B009 Herpesviral infection, unspecified: Secondary | ICD-10-CM

## 2017-04-29 MED ORDER — SILDENAFIL CITRATE 100 MG PO TABS: 100.0000 mg | ORAL_TABLET | ORAL | 11 refills | Status: DC | PRN

## 2017-04-29 MED ORDER — VALACYCLOVIR HCL 1 G PO TABS: 1000.0000 mg | ORAL_TABLET | Freq: Every day | ORAL | 11 refills | Status: DC

## 2017-04-29 NOTE — Progress Notes (Signed)
   HPI  Patient presents today for an annual physical exam   Patient has occupational active but denies any persistent regular exercise, he watches diet moderately.  Breathing is stable, using Dulera as prescribed, using albuterol occasionally, does have some shortness of breath especially in the heat.  ED-doing well with Viagra, needs refill.  HSV Doing well with Valtrex, needs refill.  Patient complains of bilateral knee pain for years, this seems to be getting worse. He complains of bilateral medial and lateral knee pain that's worsened throughout the day, he works by standing on concrete all day. He uses 2 Aleve once daily with some improvement.  PMH: Smoking status noted ROS: Per HPI  Objective: BP (!) 143/88   Pulse 76   Temp 98.4 F (36.9 C) (Oral)   Ht '5\' 11"'$  (1.803 m)   Wt 174 lb 12.8 oz (79.3 kg)   BMI 24.38 kg/m  Gen: NAD, alert, cooperative with exam HEENT: NCAT, oropharynx moist and clear, EOMI, PERRLA, TMs normal bilaterally, nares clear CV: RRR, good S1/S2, no murmur Resp: CTABL, no wheezes, non-labored Abd: SNTND, BS present, no guarding or organomegaly Ext: No edema, warm Neuro: Alert and oriented, No gross deficits MSK: BL knee without erythema, effusion, bruising, or gross deformity Positive crepitus Positive medial joint line tenderness bilaterally  ligamentously intact to Lachman's and with varus and valgus stress.  Negative McMurray's test   Assessment and plan:  # Annual physical exam Normal exam Labs today Offered colonoscopy, he will consider  # Erectile dysfunction Continue Viagra, refill  # Bilateral knee pain Likely OA X-ray Offered orthopedics, he will consider  # HSV 2 Stable, continue Valtrex  Hypertension Reasonable control No changes    Orders Placed This Encounter  Procedures  . DG Knee 1-2 Views Right    Standing Status:   Future    Number of Occurrences:   1    Standing Expiration Date:   06/29/2018    Order  Specific Question:   Reason for Exam (SYMPTOM  OR DIAGNOSIS REQUIRED)    Answer:   BL knee pain, eval for OA    Order Specific Question:   Preferred imaging location?    Answer:   Internal  . CMP14+EGFR  . CBC with Differential/Platelet  . Lipid panel  . TSH  . PSA    Meds ordered this encounter  Medications  . valACYclovir (VALTREX) 1000 MG tablet    Sig: Take 1 tablet (1,000 mg total) by mouth daily.    Dispense:  30 tablet    Refill:  11  . sildenafil (VIAGRA) 100 MG tablet    Sig: Take 1 tablet (100 mg total) by mouth as needed for erectile dysfunction.    Dispense:  8 tablet    Refill:  [place], MD [place] 04/29/2017, 10:30 AM

## 2017-04-29 NOTE — Patient Instructions (Signed)
Great to see you!  Consider a colonoscopy   Health Maintenance, Male A healthy lifestyle and preventive care is important for your health and wellness. Ask your health care provider about what schedule of regular examinations is right for you. What should I know about weight and diet? Eat a Healthy Diet  Eat plenty of vegetables, fruits, whole grains, low-fat dairy products, and lean protein.  Do not eat a lot of foods high in solid fats, added sugars, or salt.  Maintain a Healthy Weight Regular exercise can help you achieve or maintain a healthy weight. You should:  Do at least 150 minutes of exercise each week. The exercise should increase your heart rate and make you sweat (moderate-intensity exercise).  Do strength-training exercises at least twice a week.  Watch Your Levels of Cholesterol and Blood Lipids  Have your blood tested for lipids and cholesterol every 5 years starting at 62 years of age. If you are at high risk for heart disease, you should start having your blood tested when you are 62 years old. You may need to have your cholesterol levels checked more often if: ? Your lipid or cholesterol levels are high. ? You are older than 62 years of age. ? You are at high risk for heart disease.  What should I know about cancer screening? Many types of cancers can be detected early and may often be prevented. Lung Cancer  You should be screened every year for lung cancer if: ? You are a current smoker who has smoked for at least 30 years. ? You are a former smoker who has quit within the past 15 years.  Talk to your health care provider about your screening options, when you should start screening, and how often you should be screened.  Colorectal Cancer  Routine colorectal cancer screening usually begins at 62 years of age and should be repeated every 5-10 years until you are 62 years old. You may need to be screened more often if early forms of precancerous polyps or  small growths are found. Your health care provider may recommend screening at an earlier age if you have risk factors for colon cancer.  Your health care provider may recommend using home test kits to check for hidden blood in the stool.  A small camera at the end of a tube can be used to examine your colon (sigmoidoscopy or colonoscopy). This checks for the earliest forms of colorectal cancer.  Prostate and Testicular Cancer  Depending on your age and overall health, your health care provider may do certain tests to screen for prostate and testicular cancer.  Talk to your health care provider about any symptoms or concerns you have about testicular or prostate cancer.  Skin Cancer  Check your skin from head to toe regularly.  Tell your health care provider about any new moles or changes in moles, especially if: ? There is a change in a mole's size, shape, or color. ? You have a mole that is larger than a pencil eraser.  Always use sunscreen. Apply sunscreen liberally and repeat throughout the day.  Protect yourself by wearing long sleeves, pants, a wide-brimmed hat, and sunglasses when outside.  What should I know about heart disease, diabetes, and high blood pressure?  If you are 49-44 years of age, have your blood pressure checked every 3-5 years. If you are 24 years of age or older, have your blood pressure checked every year. You should have your blood pressure measured twice-once when  you are at a hospital or clinic, and once when you are not at a hospital or clinic. Record the average of the two measurements. To check your blood pressure when you are not at a hospital or clinic, you can use: ? An automated blood pressure machine at a pharmacy. ? A home blood pressure monitor.  Talk to your health care provider about your target blood pressure.  If you are between 66-16 years old, ask your health care provider if you should take aspirin to prevent heart disease.  Have regular  diabetes screenings by checking your fasting blood sugar level. ? If you are at a normal weight and have a low risk for diabetes, have this test once every three years after the age of 66. ? If you are overweight and have a high risk for diabetes, consider being tested at a younger age or more often.  A one-time screening for abdominal aortic aneurysm (AAA) by ultrasound is recommended for men aged 5-75 years who are current or former smokers. What should I know about preventing infection? Hepatitis B If you have a higher risk for hepatitis B, you should be screened for this virus. Talk with your health care provider to find out if you are at risk for hepatitis B infection. Hepatitis C Blood testing is recommended for:  Everyone born from [birth year] through 1966.  Anyone with known risk factors for hepatitis C.  Sexually Transmitted Diseases (STDs)  You should be screened each year for STDs including gonorrhea and chlamydia if: ? You are sexually active and are younger than 62 years of age. ? You are older than 62 years of age and your health care provider tells you that you are at risk for this type of infection. ? Your sexual activity has changed since you were last screened and you are at an increased risk for chlamydia or gonorrhea. Ask your health care provider if you are at risk.  Talk with your health care provider about whether you are at high risk of being infected with HIV. Your health care provider may recommend a prescription medicine to help prevent HIV infection.  What else can I do?  Schedule regular health, dental, and eye exams.  Stay current with your vaccines (immunizations).  Do not use any tobacco products, such as cigarettes, chewing tobacco, and e-cigarettes. If you need help quitting, ask your health care provider.  Limit alcohol intake to no more than 2 drinks per day. One drink equals 12 ounces of beer, 5 ounces of wine, or 1 ounces of hard liquor.  Do not use  street drugs.  Do not share needles.  Ask your health care provider for help if you need support or information about quitting drugs.  Tell your health care provider if you often feel depressed.  Tell your health care provider if you have ever been abused or do not feel safe at home. This information is not intended to replace advice given to you by your health care provider. Make sure you discuss any questions you have with your health care provider. Document Released: 01/10/2008 Document Revised: 03/12/2016 Document Reviewed: 04/17/2015 Elsevier Interactive Patient Education  [employer].

## 2017-04-30 ENCOUNTER — Other Ambulatory Visit: Payer: Self-pay | Admitting: Family Medicine

## 2017-04-30 DIAGNOSIS — M170 Bilateral primary osteoarthritis of knee: Secondary | ICD-10-CM

## 2017-04-30 LAB — CMP14+EGFR
A/G RATIO: 2 (ref 1.2–2.2)
ALT: 23 IU/L (ref 0–44)
AST: 24 IU/L (ref 0–40)
Albumin: 4.5 g/dL (ref 3.6–4.8)
Alkaline Phosphatase: 61 IU/L (ref 39–117)
BILIRUBIN TOTAL: 1.3 mg/dL — AB (ref 0.0–1.2)
BUN/Creatinine Ratio: 13 (ref 10–24)
BUN: 9 mg/dL (ref 8–27)
CALCIUM: 9.1 mg/dL (ref 8.6–10.2)
CHLORIDE: 108 mmol/L — AB (ref 96–106)
CO2: 20 mmol/L (ref 20–29)
Creatinine, Ser: 0.71 mg/dL — ABNORMAL LOW (ref 0.76–1.27)
GFR, EST AFRICAN AMERICAN: 116 mL/min/{1.73_m2} (ref >59)
GFR, EST NON AFRICAN AMERICAN: 101 mL/min/{1.73_m2} (ref >59)
GLOBULIN, TOTAL: 2.3 g/dL (ref 1.5–4.5)
Glucose: 88 mg/dL (ref 65–99)
POTASSIUM: 4 mmol/L (ref 3.5–5.2)
SODIUM: 143 mmol/L (ref 134–144)
Total Protein: 6.8 g/dL (ref 6.0–8.5)

## 2017-04-30 LAB — LIPID PANEL
Chol/HDL Ratio: 2.7 ratio (ref 0.0–5.0)
Cholesterol, Total: 167 mg/dL (ref 100–199)
HDL: 61 mg/dL (ref >39)
LDL Calculated: 56 mg/dL (ref 0–99)
Triglycerides: 252 mg/dL — ABNORMAL HIGH (ref 0–149)
VLDL Cholesterol Cal: 50 mg/dL — ABNORMAL HIGH (ref 5–40)

## 2017-04-30 LAB — CBC WITH DIFFERENTIAL/PLATELET
BASOS ABS: 0.1 10*3/uL (ref 0.0–0.2)
BASOS: 1 %
EOS (ABSOLUTE): 0.2 10*3/uL (ref 0.0–0.4)
Eos: 5 %
Hematocrit: 47 % (ref 37.5–51.0)
Hemoglobin: 16.7 g/dL (ref 13.0–17.7)
Immature Grans (Abs): 0 10*3/uL (ref 0.0–0.1)
Immature Granulocytes: 1 %
Lymphocytes Absolute: 2 10*3/uL (ref 0.7–3.1)
Lymphs: 42 %
MCH: 33.4 pg — AB (ref 26.6–33.0)
MCHC: 35.5 g/dL (ref 31.5–35.7)
MCV: 94 fL (ref 79–97)
MONOS ABS: 0.4 10*3/uL (ref 0.1–0.9)
Monocytes: 9 %
NEUTROS ABS: 1.9 10*3/uL (ref 1.4–7.0)
NEUTROS PCT: 42 %
PLATELETS: 258 10*3/uL (ref 150–379)
RBC: 5 x10E6/uL (ref 4.14–5.80)
RDW: 13.2 % (ref 12.3–15.4)
WBC: 4.6 10*3/uL (ref 3.4–10.8)

## 2017-04-30 LAB — PSA: PROSTATE SPECIFIC AG, SERUM: 0.8 ng/mL (ref 0.0–4.0)

## 2017-04-30 LAB — TSH: TSH: 0.982 u[IU]/mL (ref 0.450–4.500)

## 2017-06-05 ENCOUNTER — Other Ambulatory Visit: Payer: Self-pay | Admitting: Family Medicine

## 2017-06-05 DIAGNOSIS — I10 Essential (primary) hypertension: Secondary | ICD-10-CM

## 2017-08-03 ENCOUNTER — Other Ambulatory Visit: Payer: Self-pay | Admitting: Family Medicine

## 2018-05-14 ENCOUNTER — Other Ambulatory Visit: Payer: Self-pay | Admitting: *Deleted

## 2018-05-14 DIAGNOSIS — N522 Drug-induced erectile dysfunction: Secondary | ICD-10-CM

## 2019-04-15 ENCOUNTER — Other Ambulatory Visit: Payer: Self-pay

## 2019-04-18 ENCOUNTER — Ambulatory Visit (INDEPENDENT_AMBULATORY_CARE_PROVIDER_SITE_OTHER): Payer: Self-pay | Admitting: Physician Assistant

## 2019-04-18 ENCOUNTER — Other Ambulatory Visit: Payer: Self-pay

## 2019-04-18 ENCOUNTER — Encounter: Payer: Self-pay | Admitting: Physician Assistant

## 2019-04-18 VITALS — BP 179/106 | HR 92 | Temp 98.4°F | Ht 71.0 in | Wt 184.4 lb

## 2019-04-18 DIAGNOSIS — M25561 Pain in right knee: Secondary | ICD-10-CM

## 2019-04-18 DIAGNOSIS — I10 Essential (primary) hypertension: Secondary | ICD-10-CM

## 2019-04-18 DIAGNOSIS — M25562 Pain in left knee: Secondary | ICD-10-CM

## 2019-04-18 DIAGNOSIS — J449 Chronic obstructive pulmonary disease, unspecified: Secondary | ICD-10-CM

## 2019-04-18 DIAGNOSIS — N529 Male erectile dysfunction, unspecified: Secondary | ICD-10-CM

## 2019-04-18 DIAGNOSIS — G8929 Other chronic pain: Secondary | ICD-10-CM

## 2019-04-18 MED ORDER — SILDENAFIL CITRATE 20 MG PO TABS: 40.0000 mg | ORAL_TABLET | Freq: Every day | ORAL | 5 refills | Status: DC

## 2019-04-18 MED ORDER — IBUPROFEN 800 MG PO TABS: 800.0000 mg | ORAL_TABLET | Freq: Three times a day (TID) | ORAL | 5 refills | Status: DC | PRN

## 2019-04-18 MED ORDER — TRIAMCINOLONE ACETONIDE 0.1 % EX CREA: TOPICAL_CREAM | CUTANEOUS | 2 refills | Status: DC

## 2019-04-18 MED ORDER — AMLODIPINE BESYLATE 5 MG PO TABS: 5.0000 mg | ORAL_TABLET | Freq: Every day | ORAL | 5 refills | Status: DC

## 2019-04-18 NOTE — Progress Notes (Signed)
BP (!) 179/106   Pulse 92   Temp 98.4 F (36.9 C) (Temporal)   Ht '5\' 11"'  (1.803 m)   Wt 184 lb 6.4 oz (83.6 kg)   SpO2 95%   BMI 25.72 kg/m    Subjective:    Patient ID: Justin Gill, male    DOB: Jun 25, 1956, 63 y.o.   MRN: [id number]  HPI: Justin Gill is a 63 y.o. male presenting on 04/18/2019 for No chief complaint on file.  This patient comes in for.  Recheck on his chronic medical conditions which do include COPD, hypertension, arthritis, erectile dysfunction.  All of his medications are reviewed and updated.  He states that he has not been able to have his medicine for some time.  He has no insurance.  In March he will have Medicare insurance.  And at that time he states that he would like to get another check done, labs, and referred for screening colonoscopy.  He is not complain of any abnormalities at this time.  Past Medical History:  Diagnosis Date  . Arthritis   . Asthma   . Hypertension    Relevant past medical, surgical, family and social history reviewed and updated as indicated. Interim medical history since our last visit reviewed. Allergies and medications reviewed and updated. DATA REVIEWED: CHART IN EPIC  Family History reviewed for pertinent findings.  Review of Systems  Constitutional: Negative.  Negative for appetite change and fatigue.  Eyes: Negative for pain and visual disturbance.  Respiratory: Negative.  Negative for cough, chest tightness, shortness of breath and wheezing.   Cardiovascular: Negative.  Negative for chest pain, palpitations and leg swelling.  Gastrointestinal: Negative.  Negative for abdominal pain, diarrhea, nausea and vomiting.  Genitourinary: Negative.   Musculoskeletal: Positive for arthralgias and joint swelling.  Skin: Negative.  Negative for color change and rash.  Neurological: Negative.  Negative for weakness, numbness and headaches.  Psychiatric/Behavioral: Negative.     Allergies as of 04/18/2019      Reactions   Bee  Venom       Medication List       Accurate as of April 18, 2019  9:10 PM. If you have any questions, ask your nurse or doctor.        STOP taking these medications   Aleve 220 MG tablet Generic drug: naproxen sodium Stopped by: Terald Sleeper, PA-C   cyclobenzaprine 10 MG tablet Commonly known as: FLEXERIL Stopped by: Terald Sleeper, PA-C   valACYclovir 1000 MG tablet Commonly known as: VALTREX Stopped by: Terald Sleeper, PA-C     TAKE these medications   albuterol 108 (90 Base) MCG/ACT inhaler Commonly known as: VENTOLIN HFA Inhale 2 puffs into the lungs every 6 (six) hours as needed for wheezing.   amLODipine 5 MG tablet Commonly known as: NORVASC Take 1 tablet (5 mg total) by mouth daily.   B-1 PO Take 1 tablet by mouth daily.   B-12 PO Take 1 tablet by mouth daily.   Dulera 200-5 MCG/ACT Aero Generic drug: mometasone-formoterol INHALE 2 PUFFS INTO THE LUNGS 2 (TWO) TIMES DAILY.   ibuprofen 800 MG tablet Commonly known as: ADVIL Take 1 tablet (800 mg total) by mouth every 8 (eight) hours as needed.   multivitamin with minerals Tabs tablet Take 1 tablet by mouth daily.   sildenafil 100 MG tablet Commonly known as: Viagra Take 1 tablet (100 mg total) by mouth as needed for erectile dysfunction.   sildenafil 20 MG tablet Commonly  known as: REVATIO Take 2-5 tablets (40-100 mg total) by mouth daily. Started by: Terald Sleeper, PA-C   triamcinolone cream 0.1 % Commonly known as: KENALOG APPLY TO AFFECTED AREA TWICE A DAY          Objective:    BP (!) 179/106   Pulse 92   Temp 98.4 F (36.9 C) (Temporal)   Ht '5\' 11"'  (1.803 m)   Wt 184 lb 6.4 oz (83.6 kg)   SpO2 95%   BMI 25.72 kg/m   Allergies  Allergen Reactions  . Bee Venom     Wt Readings from Last 3 Encounters:  04/18/19 184 lb 6.4 oz (83.6 kg)  04/29/17 174 lb 12.8 oz (79.3 kg)  03/13/17 170 lb 12.8 oz (77.5 kg)    Physical Exam Vitals signs and nursing note reviewed.   Constitutional:      General: He is not in acute distress.    Appearance: He is well-developed.  HENT:     Head: Normocephalic and atraumatic.  Eyes:     Conjunctiva/sclera: Conjunctivae normal.     Pupils: Pupils are equal, round, and reactive to light.  Cardiovascular:     Rate and Rhythm: Normal rate and regular rhythm.     Heart sounds: Normal heart sounds.  Pulmonary:     Effort: Pulmonary effort is normal. No respiratory distress.     Breath sounds: Normal breath sounds.  Skin:    General: Skin is warm and dry.  Psychiatric:        Behavior: Behavior normal.     Results for orders placed or performed in visit on 04/29/17  CMP14+EGFR  Result Value Ref Range   Glucose 88 65 - 99 mg/dL   BUN 9 8 - 27 mg/dL   Creatinine, Ser 0.71 (L) 0.76 - 1.27 mg/dL   GFR calc non Af Amer 101 >59 mL/min/1.73   GFR calc Af Amer 116 >59 mL/min/1.73   BUN/Creatinine Ratio 13 10 - 24   Sodium 143 134 - 144 mmol/L   Potassium 4.0 3.5 - 5.2 mmol/L   Chloride 108 (H) 96 - 106 mmol/L   CO2 20 20 - 29 mmol/L   Calcium 9.1 8.6 - 10.2 mg/dL   Total Protein 6.8 6.0 - 8.5 g/dL   Albumin 4.5 3.6 - 4.8 g/dL   Globulin, Total 2.3 1.5 - 4.5 g/dL   Albumin/Globulin Ratio 2.0 1.2 - 2.2   Bilirubin Total 1.3 (H) 0.0 - 1.2 mg/dL   Alkaline Phosphatase 61 39 - 117 IU/L   AST 24 0 - 40 IU/L   ALT 23 0 - 44 IU/L  CBC with Differential/Platelet  Result Value Ref Range   WBC 4.6 3.4 - 10.8 x10E3/uL   RBC 5.00 4.14 - 5.80 x10E6/uL   Hemoglobin 16.7 13.0 - 17.7 g/dL   Hematocrit 47.0 37.5 - 51.0 %   MCV 94 79 - 97 fL   MCH 33.4 (H) 26.6 - 33.0 pg   MCHC 35.5 31.5 - 35.7 g/dL   RDW 13.2 12.3 - 15.4 %   Platelets 258 150 - 379 x10E3/uL   Neutrophils 42 Not Estab. %   Lymphs 42 Not Estab. %   Monocytes 9 Not Estab. %   Eos 5 Not Estab. %   Basos 1 Not Estab. %   Neutrophils Absolute 1.9 1.4 - 7.0 x10E3/uL   Lymphocytes Absolute 2.0 0.7 - 3.1 x10E3/uL   Monocytes Absolute 0.4 0.1 - 0.9 x10E3/uL    EOS (ABSOLUTE) 0.2  0.0 - 0.4 x10E3/uL   Basophils Absolute 0.1 0.0 - 0.2 x10E3/uL   Immature Granulocytes 1 Not Estab. %   Immature Grans (Abs) 0.0 0.0 - 0.1 x10E3/uL  Lipid panel  Result Value Ref Range   Cholesterol, Total 167 100 - 199 mg/dL   Triglycerides 252 (H) 0 - 149 mg/dL   HDL 61 >39 mg/dL   VLDL Cholesterol Cal 50 (H) 5 - 40 mg/dL   LDL Calculated 56 0 - 99 mg/dL   Chol/HDL Ratio 2.7 0.0 - 5.0 ratio  TSH  Result Value Ref Range   TSH 0.982 0.450 - 4.500 uIU/mL  PSA  Result Value Ref Range   Prostate Specific Ag, Serum 0.8 0.0 - 4.0 ng/mL      Assessment & Plan:   1. COPD, moderate (Carlos) Continue Dulera, he is using 1 or 2 puffs once or twice daily.  Has good control  2. Essential hypertension  - amLODipine (NORVASC) 5 MG tablet; Take 1 tablet (5 mg total) by mouth daily.  Dispense: 30 tablet; Refill: 5  3. Chronic pain of both knees - ibuprofen (ADVIL) 800 MG tablet; Take 1 tablet (800 mg total) by mouth every 8 (eight) hours as needed.  Dispense: 60 tablet; Refill: 5  4. Erectile dysfunction, unspecified erectile dysfunction type - sildenafil (REVATIO) 20 MG tablet; Take 2-5 tablets (40-100 mg total) by mouth daily.  Dispense: 60 tablet; Refill: 5   Continue all other maintenance medications as listed above.  Follow up plan:  Follow-up in approximately 6 months.  Patient will have insurance at that time.  Educational handout given for [place] PA-C [place] [address]  [place], [place] [address] [phone]   04/18/2019, 9:10 PM

## 2019-04-26 ENCOUNTER — Ambulatory Visit: Payer: BLUE CROSS/BLUE SHIELD | Admitting: Family Medicine

## 2019-07-14 ENCOUNTER — Other Ambulatory Visit: Payer: Self-pay | Admitting: Physician Assistant

## 2019-10-31 ENCOUNTER — Other Ambulatory Visit: Payer: Self-pay

## 2019-10-31 ENCOUNTER — Encounter: Payer: Self-pay | Admitting: Family Medicine

## 2019-10-31 ENCOUNTER — Ambulatory Visit (INDEPENDENT_AMBULATORY_CARE_PROVIDER_SITE_OTHER): Payer: Medicare Other | Admitting: Family Medicine

## 2019-10-31 ENCOUNTER — Ambulatory Visit: Payer: Self-pay | Admitting: Physician Assistant

## 2019-10-31 ENCOUNTER — Ambulatory Visit (INDEPENDENT_AMBULATORY_CARE_PROVIDER_SITE_OTHER): Payer: Medicare Other

## 2019-10-31 VITALS — BP 163/100 | HR 91 | Temp 96.8°F | Ht 71.0 in | Wt 182.5 lb

## 2019-10-31 DIAGNOSIS — J449 Chronic obstructive pulmonary disease, unspecified: Secondary | ICD-10-CM

## 2019-10-31 DIAGNOSIS — R0789 Other chest pain: Secondary | ICD-10-CM

## 2019-10-31 DIAGNOSIS — I10 Essential (primary) hypertension: Secondary | ICD-10-CM

## 2019-10-31 DIAGNOSIS — N529 Male erectile dysfunction, unspecified: Secondary | ICD-10-CM | POA: Diagnosis not present

## 2019-10-31 DIAGNOSIS — N522 Drug-induced erectile dysfunction: Secondary | ICD-10-CM

## 2019-10-31 MED ORDER — AMLODIPINE BESYLATE 5 MG PO TABS: 5.0000 mg | ORAL_TABLET | Freq: Every day | ORAL | 0 refills | Status: DC

## 2019-10-31 MED ORDER — DULERA 200-5 MCG/ACT IN AERO: 2.0000 | INHALATION_SPRAY | Freq: Two times a day (BID) | RESPIRATORY_TRACT | 1 refills | Status: DC

## 2019-10-31 MED ORDER — SILDENAFIL CITRATE 100 MG PO TABS: 100.0000 mg | ORAL_TABLET | ORAL | 11 refills | Status: DC | PRN

## 2019-10-31 NOTE — Progress Notes (Signed)
BP (!) 163/100   Pulse 91   Temp (!) 96.8 F (36 C)   Ht '5\' 11"'  (1.803 m)   Wt 182 lb 8 oz (82.8 kg)   SpO2 94%   BMI 25.45 kg/m    Subjective:   Patient ID: Justin Gill, male    DOB: November 13, 1955, 64 y.o.   MRN: [id number]  HPI: Kuron Docken is a 64 y.o. male presenting on 10/31/2019 for Medical Management of Chronic Issues and Hypertension   HPI Hypertension Patient is currently on amlodipine but has been out of it for 2 weeks, and their blood pressure today is 163/100. Patient denies any lightheadedness or dizziness. Patient denies headaches, blurred vision, chest pains, shortness of breath, or weakness. Denies any side effects from medication and is content with current medication.   COPD Patient is coming in for COPD recheck today.  He is currently on Dulera and albuterol as needed.  He has a mild chronic cough but denies any major coughing spells or wheezing spells.  He has some mild cough and some wheezing but he feels like is not any worse than what he normally has.  Patient has some left-sided chest wall pain from a fall when he hit on a doorway 2 days ago and he says it hurts with coughing but does not hurt just resting or with deep inspiration but mainly with coughing he has pain and very specific to a point on his left side of his chest wall but sometimes hurts through to the back as well.  He says it was worse yesterday but it is better today.  It does not hurt at rest  Relevant past medical, surgical, family and social history reviewed and updated as indicated. Interim medical history since our last visit reviewed. Allergies and medications reviewed and updated.  Review of Systems  Constitutional: Negative for chills and fever.  Eyes: Negative for visual disturbance.  Respiratory: Negative for shortness of breath and wheezing.   Cardiovascular: Positive for chest pain. Negative for palpitations and leg swelling.  Musculoskeletal: Negative for back pain and gait problem.    Skin: Negative for rash.  Neurological: Negative for dizziness, weakness and light-headedness.  All other systems reviewed and are negative.   Per HPI unless specifically indicated above   Allergies as of 10/31/2019      Reactions   Bee Venom       Medication List       Accurate as of October 31, 2019  8:40 AM. If you have any questions, ask your nurse or doctor.        albuterol 108 (90 Base) MCG/ACT inhaler Commonly known as: VENTOLIN HFA Inhale 2 puffs into the lungs every 6 (six) hours as needed for wheezing.   amLODipine 5 MG tablet Commonly known as: NORVASC Take 1 tablet (5 mg total) by mouth daily.   B-1 PO Take 1 tablet by mouth daily.   B-12 PO Take 1 tablet by mouth daily.   Dulera 200-5 MCG/ACT Aero Generic drug: mometasone-formoterol Inhale 2 puffs into the lungs 2 (two) times daily.   ibuprofen 800 MG tablet Commonly known as: ADVIL Take 1 tablet (800 mg total) by mouth every 8 (eight) hours as needed.   multivitamin with minerals Tabs tablet Take 1 tablet by mouth daily.   sildenafil 100 MG tablet Commonly known as: Viagra Take 1 tablet (100 mg total) by mouth as needed for erectile dysfunction.   sildenafil 20 MG tablet Commonly known as: REVATIO Take  2-5 tablets (40-100 mg total) by mouth daily.   triamcinolone cream 0.1 % Commonly known as: KENALOG APPLY TO AFFECTED AREA TWICE A DAY        Objective:   BP (!) 163/100   Pulse 91   Temp (!) 96.8 F (36 C)   Ht '5\' 11"'  (1.803 m)   Wt 182 lb 8 oz (82.8 kg)   SpO2 94%   BMI 25.45 kg/m   Wt Readings from Last 3 Encounters:  10/31/19 182 lb 8 oz (82.8 kg)  04/18/19 184 lb 6.4 oz (83.6 kg)  04/29/17 174 lb 12.8 oz (79.3 kg)    Physical Exam Vitals and nursing note reviewed.  Constitutional:      General: He is not in acute distress.    Appearance: He is well-developed. He is not diaphoretic.  Eyes:     General: No scleral icterus.    Conjunctiva/sclera: Conjunctivae normal.   Neck:     Thyroid: No thyromegaly.  Cardiovascular:     Rate and Rhythm: Normal rate and regular rhythm.     Heart sounds: Normal heart sounds. No murmur.  Pulmonary:     Effort: Pulmonary effort is normal. No respiratory distress.     Breath sounds: Wheezing (Coarse wheezes throughout) present.  Chest:     Chest wall: Tenderness (Point tenderness in overlying the fifth and sixth rib in the midclavicular area) present.  Musculoskeletal:        General: Normal range of motion.     Cervical back: Neck supple.  Lymphadenopathy:     Cervical: No cervical adenopathy.  Skin:    General: Skin is warm and dry.     Findings: No rash.  Neurological:     Mental Status: He is alert and oriented to person, place, and time.     Coordination: Coordination normal.  Psychiatric:        Behavior: Behavior normal.       Assessment & Plan:   Problem List Items Addressed This Visit      Cardiovascular and Mediastinum   HTN (hypertension) - Primary   Relevant Medications   amLODipine (NORVASC) 5 MG tablet   sildenafil (VIAGRA) 100 MG tablet   Other Relevant Orders   CMP14+EGFR   Lipid panel     Respiratory   COPD, moderate (HCC)   Relevant Medications   mometasone-formoterol (DULERA) 200-5 MCG/ACT AERO   Other Relevant Orders   CBC with Differential/Platelet     Other   Erectile dysfunction   Relevant Medications   sildenafil (VIAGRA) 100 MG tablet    Other Visit Diagnoses    Left-sided chest wall pain       Relevant Orders   DG Ribs Unilateral W/Chest Left      Will restart amlodipine, have come back in 3 to 4 weeks to recheck his blood pressure with his new PCP [facility].  Patient is course and has some wheezing throughout but it seems to be his baseline.  Patient had a fall with some left-sided chest pain that is reproducible on touch, will do chest x-ray to look at his ribs. Follow up plan: Return in about 4 weeks (around 11/28/2019), or if symptoms worsen or fail  to improve, for Follow-up with PCP Evelina Dun for hypertension.  Counseling provided for all of the vaccine components Orders Placed This Encounter  Procedures  . DG Ribs Unilateral W/Chest Left  . CBC with Differential/Platelet  . CMP14+EGFR  . Lipid panel    Vonna Kotyk  Cornelia Walraven, MD [facility] 10/31/2019, 8:40 AM

## 2019-11-01 LAB — CBC WITH DIFFERENTIAL/PLATELET
Basophils Absolute: 0.1 10*3/uL (ref 0.0–0.2)
Basos: 0 %
EOS (ABSOLUTE): 0 10*3/uL (ref 0.0–0.4)
Eos: 0 %
Hematocrit: 58.5 % — ABNORMAL HIGH (ref 37.5–51.0)
Hemoglobin: 20.3 g/dL (ref 13.0–17.7)
Immature Grans (Abs): 0.1 10*3/uL (ref 0.0–0.1)
Immature Granulocytes: 1 %
Lymphocytes Absolute: 1.5 10*3/uL (ref 0.7–3.1)
Lymphs: 12 %
MCH: 32 pg (ref 26.6–33.0)
MCHC: 34.7 g/dL (ref 31.5–35.7)
MCV: 92 fL (ref 79–97)
Monocytes Absolute: 0.7 10*3/uL (ref 0.1–0.9)
Monocytes: 6 %
Neutrophils Absolute: 10.1 10*3/uL — ABNORMAL HIGH (ref 1.4–7.0)
Neutrophils: 81 %
Platelets: 282 10*3/uL (ref 150–450)
RBC: 6.34 x10E6/uL — ABNORMAL HIGH (ref 4.14–5.80)
RDW: 12.8 % (ref 11.6–15.4)
WBC: 12.4 10*3/uL — ABNORMAL HIGH (ref 3.4–10.8)

## 2019-11-01 LAB — CMP14+EGFR
ALT: 14 IU/L (ref 0–44)
AST: 18 IU/L (ref 0–40)
Albumin/Globulin Ratio: 1.6 (ref 1.2–2.2)
Albumin: 4.6 g/dL (ref 3.8–4.8)
Alkaline Phosphatase: 81 IU/L (ref 39–117)
BUN/Creatinine Ratio: 9 — ABNORMAL LOW (ref 10–24)
BUN: 10 mg/dL (ref 8–27)
Bilirubin Total: 4.7 mg/dL — ABNORMAL HIGH (ref 0.0–1.2)
CO2: 17 mmol/L — ABNORMAL LOW (ref 20–29)
Calcium: 10 mg/dL (ref 8.6–10.2)
Chloride: 105 mmol/L (ref 96–106)
Creatinine, Ser: 1.09 mg/dL (ref 0.76–1.27)
GFR calc Af Amer: 82 mL/min/{1.73_m2} (ref >59)
GFR calc non Af Amer: 71 mL/min/{1.73_m2} (ref >59)
Globulin, Total: 2.8 g/dL (ref 1.5–4.5)
Glucose: 100 mg/dL — ABNORMAL HIGH (ref 65–99)
Potassium: 4.4 mmol/L (ref 3.5–5.2)
Sodium: 143 mmol/L (ref 134–144)
Total Protein: 7.4 g/dL (ref 6.0–8.5)

## 2019-11-01 LAB — LIPID PANEL
Chol/HDL Ratio: 2.6 ratio (ref 0.0–5.0)
Cholesterol, Total: 137 mg/dL (ref 100–199)
HDL: 53 mg/dL (ref >39)
LDL Chol Calc (NIH): 63 mg/dL (ref 0–99)
Triglycerides: 118 mg/dL (ref 0–149)
VLDL Cholesterol Cal: 21 mg/dL (ref 5–40)

## 2019-11-08 ENCOUNTER — Other Ambulatory Visit: Payer: Self-pay | Admitting: Family

## 2019-11-08 DIAGNOSIS — D582 Other hemoglobinopathies: Secondary | ICD-10-CM

## 2019-11-30 ENCOUNTER — Ambulatory Visit: Payer: Medicare Other | Admitting: Family

## 2019-12-22 ENCOUNTER — Ambulatory Visit: Payer: Medicare Other | Admitting: Family

## 2020-01-25 ENCOUNTER — Other Ambulatory Visit: Payer: Self-pay | Admitting: *Deleted

## 2020-01-25 DIAGNOSIS — N529 Male erectile dysfunction, unspecified: Secondary | ICD-10-CM

## 2020-01-25 MED ORDER — SILDENAFIL CITRATE 20 MG PO TABS: 40.0000 mg | ORAL_TABLET | Freq: Every day | ORAL | 5 refills | Status: DC

## 2020-02-15 ENCOUNTER — Other Ambulatory Visit: Payer: Self-pay | Admitting: *Deleted

## 2020-02-15 DIAGNOSIS — I10 Essential (primary) hypertension: Secondary | ICD-10-CM

## 2020-02-15 MED ORDER — AMLODIPINE BESYLATE 5 MG PO TABS: 5.0000 mg | ORAL_TABLET | Freq: Every day | ORAL | 0 refills | Status: DC

## 2020-08-31 ENCOUNTER — Encounter: Payer: Self-pay | Admitting: Family

## 2020-08-31 ENCOUNTER — Other Ambulatory Visit: Payer: Self-pay

## 2020-08-31 ENCOUNTER — Ambulatory Visit (INDEPENDENT_AMBULATORY_CARE_PROVIDER_SITE_OTHER): Payer: Medicare Other | Admitting: Family

## 2020-08-31 VITALS — BP 126/86 | HR 80 | Temp 97.9°F | Ht 71.0 in | Wt 180.0 lb

## 2020-08-31 DIAGNOSIS — J449 Chronic obstructive pulmonary disease, unspecified: Secondary | ICD-10-CM

## 2020-08-31 DIAGNOSIS — Z1211 Encounter for screening for malignant neoplasm of colon: Secondary | ICD-10-CM

## 2020-08-31 DIAGNOSIS — G8929 Other chronic pain: Secondary | ICD-10-CM | POA: Diagnosis not present

## 2020-08-31 DIAGNOSIS — M25562 Pain in left knee: Secondary | ICD-10-CM

## 2020-08-31 DIAGNOSIS — Z720 Tobacco use: Secondary | ICD-10-CM

## 2020-08-31 DIAGNOSIS — M25561 Pain in right knee: Secondary | ICD-10-CM

## 2020-08-31 DIAGNOSIS — Z0000 Encounter for general adult medical examination without abnormal findings: Secondary | ICD-10-CM

## 2020-08-31 DIAGNOSIS — N522 Drug-induced erectile dysfunction: Secondary | ICD-10-CM

## 2020-08-31 DIAGNOSIS — I10 Essential (primary) hypertension: Secondary | ICD-10-CM | POA: Diagnosis not present

## 2020-08-31 MED ORDER — IBUPROFEN 800 MG PO TABS: 800.0000 mg | ORAL_TABLET | Freq: Three times a day (TID) | ORAL | 5 refills | Status: DC | PRN

## 2020-08-31 MED ORDER — SILDENAFIL CITRATE 100 MG PO TABS
100.0000 mg | ORAL_TABLET | ORAL | 1 refills | Status: DC | PRN
Start: 2020-08-31 — End: 2021-07-01

## 2020-08-31 MED ORDER — BUDESONIDE-FORMOTEROL FUMARATE 160-4.5 MCG/ACT IN AERO: 2.0000 | INHALATION_SPRAY | Freq: Two times a day (BID) | RESPIRATORY_TRACT | 3 refills | Status: DC

## 2020-08-31 MED ORDER — TRIAMCINOLONE ACETONIDE 0.1 % EX CREA: TOPICAL_CREAM | Freq: Two times a day (BID) | CUTANEOUS | 0 refills | Status: DC

## 2020-08-31 MED ORDER — AMLODIPINE BESYLATE 5 MG PO TABS: 5.0000 mg | ORAL_TABLET | Freq: Every day | ORAL | 0 refills | Status: DC

## 2020-08-31 NOTE — Progress Notes (Signed)
Subjective:   Justin Gill is a 65 y.o. male who presents for a Welcome to Medicare exam.   Review of Systems: WNL  PT states his [name] and Viagra are too expensive. He continues to smoke 1/2 pack a day. His breathing is stable.        Objective:    Today's Vitals   08/31/20 1217 08/31/20 1219  BP: (!) 144/94 (!) 141/90  Pulse: 89   Temp: 97.9 F (36.6 C)   TempSrc: Temporal   Weight: 180 lb (81.6 kg)   Height: 5\' 11"  (1.803 m)    Body mass index is 25.1 kg/m.  Medications Outpatient Encounter Medications as of 08/31/2020  Medication Sig  . amLODipine (NORVASC) 5 MG tablet Take 1 tablet (5 mg total) by mouth daily.  . Cyanocobalamin (B-12 PO) Take 1 tablet by mouth daily.  10/29/2020 ibuprofen (ADVIL) 800 MG tablet Take 1 tablet (800 mg total) by mouth every 8 (eight) hours as needed.  . Multiple Vitamin (MULTIVITAMIN WITH MINERALS) TABS Take 1 tablet by mouth daily.  . sildenafil (REVATIO) 20 MG tablet Take 2-5 tablets (40-100 mg total) by mouth daily.  . sildenafil (VIAGRA) 100 MG tablet Take 1 tablet (100 mg total) by mouth as needed for erectile dysfunction.  . Thiamine HCl (B-1 PO) Take 1 tablet by mouth daily.  Marland Kitchen triamcinolone cream (KENALOG) 0.1 % APPLY TO AFFECTED AREA TWICE A DAY  . albuterol (PROVENTIL HFA;VENTOLIN HFA) 108 (90 Base) MCG/ACT inhaler Inhale 2 puffs into the lungs every 6 (six) hours as needed for wheezing. (Patient not taking: Reported on 08/31/2020)  . mometasone-formoterol (DULERA) 200-5 MCG/ACT AERO Inhale 2 puffs into the lungs 2 (two) times daily. (Patient not taking: Reported on 08/31/2020)   No facility-administered encounter medications on file as of 08/31/2020.     History: Past Medical History:  Diagnosis Date  . Arthritis   . Asthma   . Hypertension    Past Surgical History:  Procedure Laterality Date  . ORTHOPEDIC SURGERY    . PERCUTANEOUS PINNING Bilateral 09/22/2012   Procedure: Bilateral hands/I&D and Repair as Needed;  Surgeon: 09/24/2012, MD;  Location: [facility] OR;  Service: Orthopedics;  Laterality: Bilateral;  . TONSILLECTOMY      Family History  Problem Relation Age of Onset  . Diabetes Mother   . Heart disease Mother   . Hyperlipidemia Mother   . Hypertension Mother    Social History   Occupational History  . Not on file  Tobacco Use  . Smoking status: Current Every Day Smoker    Packs/day: 0.25    Years: 20.00    Pack years: 5.00  . Smokeless tobacco: Never Used  Vaping Use  . Vaping Use: Never used  Substance and Sexual Activity  . Alcohol use: Yes    Alcohol/week: 12.0 standard drinks    Types: 12 Cans of beer per week  . Drug use: No  . Sexual activity: Not on file   Tobacco Counseling Ready to quit: Not Answered Counseling given: Not Answered   Immunizations and Health Maintenance Immunization History  Administered Date(s) Administered  . Janssen (J&J) SARS-COV-2 Vaccination 12/25/2019   Health Maintenance Due  Topic Date Due  . COLONOSCOPY (Pts 45-28yrs Insurance coverage will need to be confirmed)  Never done  . PNA vac Low Risk Adult (1 of 2 - PCV13) Never done  . COVID-19 Vaccine (2 - Booster for Janssen series) 02/19/2020    Activities of Daily Living No flowsheet  data found.  Physical Exam  WNL, or other factors deemed appropriate based on the beneficiary's medical and social history and current clinical standards.  Advanced Directives: Does Patient Have a Medical Advance Directive?: No Would patient like information on creating a medical advance directive?: Yes (Inpatient - patient defers creating a medical advance directive at this time - Information given)    Assessment:    This is a routine wellness  examination for this patient .   Vision/Hearing screen No exam data present  Dietary issues and exercise activities discussed:     Goals   None     Depression Screen PHQ 2/9 Scores 08/31/2020 10/31/2019 04/18/2019 04/29/2017  PHQ - 2 Score 0 0 0 0  PHQ- 9 Score 4 - -  -     Fall Risk Fall Risk  08/31/2020  Falls in the past year? 1  Number falls in past yr: 0  Injury with Fall? 1  Risk for fall due to : Impaired balance/gait  Follow up Education provided    Cognitive Function MMSE - Mini Mental State Exam 08/31/2020  Orientation to time 5  Orientation to Place 5  Registration 3  Attention/ Calculation 0  Recall 3  Language- name 2 objects 2  Language- repeat 1  Language- follow 3 step command 3  Language- read & follow direction 1  Write a sentence 1  Copy design 1  Total score 25        Patient Care Team: Sharion Balloon, FNP as PCP - General (Family Medicine)     Plan:     I have personally reviewed and noted the following in the patient's chart:   . Medical and social history . Use of alcohol, tobacco or illicit drugs  . Current medications and supplements . Functional ability and status . Nutritional status . Physical activity . Advanced directives . List of other physicians . Hospitalizations, surgeries, and ER visits in previous 12 months . Vitals . Screenings to include cognitive, depression, and falls . Referrals and appointments  Adolph Clutter comes in today with chief complaint of Welcome to medicare   Diagnosis and orders addressed:  1. Chronic pain of both knees - ibuprofen (ADVIL) 800 MG tablet; Take 1 tablet (800 mg total) by mouth every 8 (eight) hours as needed.  Dispense: 60 tablet; Refill: 5 - AMB Referral to [facility] Coordinaton  2. Essential hypertension - amLODipine (NORVASC) 5 MG tablet; Take 1 tablet (5 mg total) by mouth daily.  Dispense: 90 tablet; Refill: 0 - AMB Referral to [place]  3. Encounter for Medicare annual wellness exam - AMB Referral to [place]  4. Primary hypertension - AMB Referral to [facility] Coordinaton  5. Colon cancer screening - Ambulatory referral to Gastroenterology - AMB Referral to [facility] Coordinaton  6.  Drug-induced erectile dysfunction - We have sent to [facility] and he will use Good Rx  - sildenafil (VIAGRA) 100 MG tablet; Take 1 tablet (100 mg total) by mouth as needed for erectile dysfunction.  Dispense: 30 tablet; Refill: 1 - AMB Referral to [place]  7. COPD, moderate ([place]) Will try Symbicort to see if better coverage than Dulera - budesonide-formoterol (SYMBICORT) 160-4.5 MCG/ACT inhaler; Inhale 2 puffs into the lungs 2 (two) times daily.  Dispense: 1 each; Refill: 3 - AMB Referral to [place]  8. Tobacco abuse - AMB Referral to [place]     In addition, I have reviewed and  discussed with patient certain preventive protocols, quality metrics, and best practice recommendations. A written personalized care plan for preventive services as well as general preventive health recommendations were provided to patient.    Evelina Dun, [place] 08/31/2020

## 2020-08-31 NOTE — Patient Instructions (Signed)

## 2020-09-04 ENCOUNTER — Telehealth: Payer: Self-pay | Admitting: *Deleted

## 2020-09-04 ENCOUNTER — Encounter: Payer: Self-pay | Admitting: Internal Medicine

## 2020-09-04 NOTE — Chronic Care Management (AMB) (Signed)
  Chronic Care Management   Note  09/04/2020 Name: Justin Gill MRN: [id number] DOB: 06-Jan-1956  Justin Gill is a 65 y.o. year old male who is a primary care patient of Sharion Balloon, FNP. I reached out to Marlou Starks by phone today in response to a referral sent by Justin Gill's PCP,Hawks, Theador Hawthorne, FNP.    Justin Gill was given information about Chronic Care Management services today including:  1. CCM service includes personalized support from designated clinical staff supervised by his physician, including individualized plan of care and coordination with other care providers 2. 24/7 contact phone numbers for assistance for urgent and routine care needs. 3. Service will only be billed when office clinical staff spend 20 minutes or more in a month to coordinate care. 4. Only one practitioner may furnish and bill the service in a calendar month. 5. The patient may stop CCM services at any time (effective at the end of the month) by phone call to the office staff. 6. The patient will be responsible for cost sharing (co-pay) of up to 20% of the service fee (after annual deductible is met).  Patient agreed to services and verbal consent obtained.   Follow up plan: Telephone appointment with care management team member scheduled for:09/20/2020  [place] [facility]

## 2020-09-11 ENCOUNTER — Ambulatory Visit (INDEPENDENT_AMBULATORY_CARE_PROVIDER_SITE_OTHER): Payer: Medicare Other | Admitting: Licensed Clinical Social Worker

## 2020-09-11 DIAGNOSIS — N529 Male erectile dysfunction, unspecified: Secondary | ICD-10-CM

## 2020-09-11 DIAGNOSIS — I10 Essential (primary) hypertension: Secondary | ICD-10-CM

## 2020-09-11 DIAGNOSIS — J449 Chronic obstructive pulmonary disease, unspecified: Secondary | ICD-10-CM

## 2020-09-11 DIAGNOSIS — G8929 Other chronic pain: Secondary | ICD-10-CM

## 2020-09-11 DIAGNOSIS — Z720 Tobacco use: Secondary | ICD-10-CM

## 2020-09-11 NOTE — Chronic Care Management (AMB) (Signed)
Chronic Care Management    Clinical Social Work General Note  09/11/2020 Name: Justin Gill MRN: [id number] DOB: 03/15/1956  Justin Gill is a 65 y.o. year old male who is a primary care patient of Sharion Balloon, FNP. The CCM was consulted to assist the patient with [employer] .   Justin Gill was given information about Chronic Care Management services today including:  1. CCM service includes personalized support from designated clinical staff supervised by his physician, including individualized plan of care and coordination with other care providers 2. 24/7 contact phone numbers for assistance for urgent and routine care needs. 3. Service will only be billed when office clinical staff spend 20 minutes or more in a month to coordinate care. 4. Only one practitioner may furnish and bill the service in a calendar month. 5. The patient may stop CCM services at any time (effective at the end of the month) by phone call to the office staff. 6. The patient will be responsible for cost sharing (co-pay) of up to 20% of the service fee (after annual deductible is met).  Patient agreed to services and verbal consent obtained.   Review of patient status, including review of consultants reports, relevant laboratory and other test results, and collaboration with appropriate care team members and the patient's provider was performed as part of comprehensive patient evaluation and provision of chronic care management services.    SDOH (Social Determinants of Health) assessments and interventions performed:  Yes; risk for tobacco use; risk for depression; risk for financial strain; risk for stress; risk for physical inactivity  SDOH Interventions   Flowsheet Row Most Recent Value  SDOH Interventions   Depression Interventions/Treatment  --  [informed client of support available from [facility] and [place] [facility] from 09/11/2020 in [place]   PHQ-9 Total Score 0     GAD 7 : Generalized Anxiety Score 09/11/2020 08/31/2020  Nervous, Anxious, on Edge 0 0  Control/stop worrying 0 0  Worry too much - different things 0 0  Trouble relaxing 0 0  Restless 0 0  Easily annoyed or irritable 0 1  Afraid - awful might happen 0 0  Total GAD 7 Score 0 1  Anxiety Difficulty Not difficult at all Somewhat difficult    Outpatient Encounter Medications as of 09/11/2020  Medication Sig  . albuterol (PROVENTIL HFA;VENTOLIN HFA) 108 (90 Base) MCG/ACT inhaler Inhale 2 puffs into the lungs every 6 (six) hours as needed for wheezing. (Patient not taking: Reported on 08/31/2020)  . amLODipine (NORVASC) 5 MG tablet Take 1 tablet (5 mg total) by mouth daily.  . budesonide-formoterol (SYMBICORT) 160-4.5 MCG/ACT inhaler Inhale 2 puffs into the lungs 2 (two) times daily.  . Cyanocobalamin (B-12 PO) Take 1 tablet by mouth daily.  Marland Kitchen ibuprofen (ADVIL) 800 MG tablet Take 1 tablet (800 mg total) by mouth every 8 (eight) hours as needed.  . Multiple Vitamin (MULTIVITAMIN WITH MINERALS) TABS Take 1 tablet by mouth daily.  . sildenafil (REVATIO) 20 MG tablet Take 2-5 tablets (40-100 mg total) by mouth daily.  . sildenafil (VIAGRA) 100 MG tablet Take 1 tablet (100 mg total) by mouth as needed for erectile dysfunction.  . Thiamine HCl (B-1 PO) Take 1 tablet by mouth daily.  Marland Kitchen triamcinolone (KENALOG) 0.1 % Apply topically 2 (two) times daily.   No facility-administered encounter medications on file as of 09/11/2020.   Goal:  Coping Skills Enhanced: client needs  information on community resources and financial resources  Time Frame:  Short Term  This Visits Progress:  Not on Track  Priority:  Medium  Start Date:  09/11/2020 End Date:  12/09/2020   Current Barriers:  . Patient with mobility challenges (arthritis) . Patient with medication procurement challenges (difficulty affording his medications) . Client has financial challenges . Client has dental  needs  Clinical Goal(s)  . Over the next 30 day , patient will communicate with LCSW to discuss community resources of possible help to client  . Client  will communicate as needed in next 30 days with RNCM to discuss nursing needs of client   Interventions provided by LCSW:  . Collaboration with Sharion Balloon, FNP regarding development and update of comprehensive plan of care as evidenced by provider attestation and co-signature . Inter-disciplinary care team collaboration  . Assessed patient's care coordination needs related to *mobility challenges, medication procurement issues and financial challenges of  client   . Talked with client about [facility] Triage Nurse support and encouraged client to call Triage Nurse at [facility] as needed for nursing support . Talked with client about RNCM Chong Sicilian and nursing support through [facility] program . Talked with client about food needs, transport needs and mobility challenges at home . LCSW and client completed PHQ9 and GAD7 for client  Patient Goals/Self-Care Activities . Over the next 30 days, patient will:  - Acknowledge deficits and is motivated to resolve concern  - Contact RNCM or LCSW as needed to discuss client needs  Attend scheduled medical appointments Take prescribed medications as ordered by medical provider  Follow Up Plan: LCSW to call client on 10/09/2020 to assess client needs at that time    Norva Riffle.Dareion Kneece MSW, LCSW Licensed Clinical Social Worker [facility] Care Management [phone]

## 2020-09-11 NOTE — Patient Instructions (Signed)
Licensed Clinical Social Worker Visit Information  Goals we discussed today:  Goals Addressed              This Visit's Progress   .  Find Help in My Community: Patient needs help wiht community resources and financial resources (pt-stated)        Timeframe:  Short-Term Goal Priority:  Medium Start Date:       09/11/2020                      Expected End Date:       12/09/2020                Follow UP Date:  10/09/2020    Find Help in My Community (Patient) Patient needs help with community resources and financial resources    Why is this important?    Knowing how and where to find help for yourself or family in your neighborhood and community is an important skill.   You will want to take some steps to learn how.   Patient Goals/Self-Care Activities . Over the next 30 days, patient will:  Acknowledge deficits and is motivated to resolve concern  Contact RNCM or LCSW as needed to discuss client needs  Attend scheduled medical appointments Take prescribed medications as ordered by medical provider        Materials Provided: No  Follow Up Plan  LCSW to call client on 10/09/2020 to assess client needs at that time  The patient verbalized understanding of instructions provided today and declined a print copy of patient instruction materials.   Norva Riffle.Flornce Record MSW, LCSW Licensed Clinical Social Worker [facility] Care Management [phone]

## 2020-09-20 ENCOUNTER — Ambulatory Visit: Payer: Medicare Other | Admitting: *Deleted

## 2020-09-20 ENCOUNTER — Telehealth: Payer: Self-pay | Admitting: *Deleted

## 2020-09-20 DIAGNOSIS — I10 Essential (primary) hypertension: Secondary | ICD-10-CM | POA: Diagnosis not present

## 2020-09-20 DIAGNOSIS — J449 Chronic obstructive pulmonary disease, unspecified: Secondary | ICD-10-CM

## 2020-09-20 DIAGNOSIS — Z720 Tobacco use: Secondary | ICD-10-CM

## 2020-09-20 NOTE — Patient Instructions (Signed)
Visit Information  PATIENT GOALS: Goals Addressed            This Visit's Progress   . Manage COPD       Timeframe:  Long-Range Goal Priority:  Medium Start Date:  09/20/20                           Expected End Date:  07/27/21                      Follow Up: over the next 30 days    . Complete, sign, and return prescription assistance application to [facility] . Call RN Care Manager as needed [phone] . Call PCP with any new or worsening symptoms [phone] . Purchase symbicort this month (or pickup samples of Dulera if available)    Why is this important?    Tracking your symptoms and other information about your health helps your doctor plan your care.   Write down the symptoms, the time of day, what you were doing and what medicine you are taking.   You will soon learn how to manage your symptoms.     Notes:     . Track and Manage My Blood Pressure-Hypertension       Timeframe:  Long-Range Goal Priority:  Medium Start Date:  09/20/20                           Expected End Date:  07/27/21                     Follow Up: over next 30 days  . Check and record blood pressure at least 3 times per week . Call PCP [phone] for any readings outside of recommended range . Take Amlodipine daily as prescribed . Call PCP with any questions or concerns regarding blood pressure or medication . Call RN Care Manager [phone] as needed     Why is this important?    You won't feel high blood pressure, but it can still hurt your blood vessels.   High blood pressure can cause heart or kidney problems. It can also cause a stroke.   Making lifestyle changes like losing a little weight or eating less salt will help.   Checking your blood pressure at home and at different times of the day can help to control blood pressure.   If the doctor prescribes medicine remember to take it the way the doctor ordered.   Call the office if you cannot afford the medicine or if there are  questions about it.     Notes:        Patient verbalizes understanding of instructions provided today and agrees to view in [place].   Follow Up Plan:  . Telephone follow up appointment with care management team member scheduled for: 10/11/20 with LCSW . RN will follow-up over the next 30 days . The patient has been provided with contact information for the care management team and has been advised to call with any health related questions or concerns.  . Next PCP appointment scheduled for: Needs scheduling . GI appointment scheduled for: 10/12/20 with Roseanne Kaufman, NP   Chong Sicilian, BSN, RN-BC [place] [facility] [place] Management Direct Dial: [phone]

## 2020-09-20 NOTE — Telephone Encounter (Signed)
Can we check on sample for patient?

## 2020-09-20 NOTE — Telephone Encounter (Signed)
We have berztri, anoro, trellegy is what we have will either work ?

## 2020-09-20 NOTE — Chronic Care Management (AMB) (Signed)
Chronic Care Management   CCM RN Visit Note  09/20/2020 Name: Justin Gill MRN: [id number] DOB: 1955-08-13  Subjective: Justin Gill is a 65 y.o. year old male who is a primary care patient of Justin Balloon, FNP. The care management team was consulted for assistance with disease management and care coordination needs.    Engaged with patient by telephone for follow up visit in response to provider referral for case management and/or care coordination services.   Consent to Services:  The patient was given information about Chronic Care Management services, agreed to services, and gave verbal consent prior to initiation of services.  Please see initial visit note for detailed documentation.   Patient agreed to services and verbal consent obtained.   Assessment: Review of patient past medical history, allergies, medications, health status, including review of consultants reports, laboratory and other test data, was performed as part of comprehensive evaluation and provision of chronic care management services.   SDOH (Social Determinants of Health) assessments and interventions performed:    CCM Care Plan  Allergies  Allergen Reactions  . Bee Venom     Outpatient Encounter Medications as of 09/20/2020  Medication Sig  . albuterol (PROVENTIL HFA;VENTOLIN HFA) 108 (90 Base) MCG/ACT inhaler Inhale 2 puffs into the lungs every 6 (six) hours as needed for wheezing. (Patient not taking: Reported on 08/31/2020)  . amLODipine (NORVASC) 5 MG tablet Take 1 tablet (5 mg total) by mouth daily.  . budesonide-formoterol (SYMBICORT) 160-4.5 MCG/ACT inhaler Inhale 2 puffs into the lungs 2 (two) times daily.  . Cyanocobalamin (B-12 PO) Take 1 tablet by mouth daily.  Marland Kitchen ibuprofen (ADVIL) 800 MG tablet Take 1 tablet (800 mg total) by mouth every 8 (eight) hours as needed.  . Multiple Vitamin (MULTIVITAMIN WITH MINERALS) TABS Take 1 tablet by mouth daily.  . sildenafil (REVATIO) 20 MG tablet Take 2-5  tablets (40-100 mg total) by mouth daily.  . sildenafil (VIAGRA) 100 MG tablet Take 1 tablet (100 mg total) by mouth as needed for erectile dysfunction.  . Thiamine HCl (B-1 PO) Take 1 tablet by mouth daily.  Marland Kitchen triamcinolone (KENALOG) 0.1 % Apply topically 2 (two) times daily.   No facility-administered encounter medications on file as of 09/20/2020.    Patient Active Problem List   Diagnosis Date Noted  . HTN (hypertension) 04/19/2015  . COPD, moderate ([place]) 04/19/2015  . Tobacco abuse 04/19/2015  . Erectile dysfunction 04/19/2015    Conditions to be addressed/monitored:HTN, COPD and smoking cessation  Care Plan : RNCM: Hypertension (Adult)  Updates made by Justin China, RN since 09/20/2020 12:00 AM    Problem: Hypertension (Hypertension)   Priority: Medium  Onset Date: 09/20/2020    Long-Range Goal: Hypertension Monitored   Start Date: 09/20/2020  This Visit's Progress: Not on track  Priority: Medium  Note:   Current Barriers:  . Chronic Disease Management support and education needs related to hypertension . Lacks caregiver support.  . 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 patient will verbalize understanding of plan for hypertension management . patient will work with Consulting civil engineer to address needs related to self-management of hypertension  Interventions:  . 1:1 collaboration with Justin Balloon, FNP regarding development and update of comprehensive plan of care as evidenced by provider attestation and co-signature . Inter-disciplinary care team collaboration (see longitudinal plan of care) . Evaluation of current treatment plan related to hypertension and patient's adherence to plan as  established by provider. . Chart reviewed including relevant office notes and lab results . Reviewed and discussed medications o Amlodipine 5 mg. Patient is compliant with medication. . Discussed recent in-office blood  pressure reading was normal . Discussed home testing o Patient has monitor but doesn't use it  . Encouraged patient to check and record blood pressure 3 times per week . Advised to call PCP with any readings outside of recommended range . Provided with RN Care Manager contact number and encouraged to reach out as needed  Patient Goals/Self-Care Activities Over the next 60 days, patient will: . Check and record blood pressure at least 3 times per week . Call PCP [phone] for any readings outside of recommended range . Take Amlodipine daily as prescribed . Call PCP with any questions or concerns regarding blood pressure or medication . Call RN Care Manager [phone] as needed    Care Plan : RNCM: COPD (Adult)  Updates made by Justin China, RN since 09/20/2020 12:00 AM    Problem: Disease Progression (COPD)     Long-Range Goal: Manage COPD   Start Date: 09/20/2020  This Visit's Progress: Not on track  Priority: Medium  Note:   Current Barriers:  . Chronic Disease Management support and education needs related to COPD . Lacks caregiver support.  . Film/video editor.  . Transportation barriers . Unable to independently drive  Nurse Case Manager Clinical Goal(s):  . patient will work with Consulting civil engineer to address needs related to self-management of COPD . patient will meet with RN Care Manager to address Rx Assistance Application for [facility] through [employer]  Interventions:  . 1:1 collaboration with Justin Balloon, FNP regarding development and update of comprehensive plan of care as evidenced by provider attestation and co-signature . Inter-disciplinary care team collaboration (see longitudinal plan of care) . Evaluation of current treatment plan related to COPD and patient's adherence to plan as established by provider. . Chart reviewed including relevant office notes and lab results . Reviewed and discussed medications o Prescribed Dulera but too expensive and not  covered by insurance o Changed to symbicort. $47 through insurance. This is still high but will try to purchase this month. o Will check with clinical staff to see if samples of Dulera or comparable med are available for this month o Discussed Rx assistance for [facility] through [employer]. Patient should qualify.  - Will collaborate with PCP to order [facility] through Rx assistance program - Will assist patient with filing out application and will have [facility] office staff mail to patient for signature . Discussed smoking status o Smokes about 1/2 pack of cigarettes a day o Plans to quit cold [place] soon. Has been successful with this in the past.  o Started smoking again when under stress after his wife moved out . Discussed smoking cessation and support given . Provided with RN Care Manager contact number and encouraged to reach out as needed  Patient Goals/Self-Care Activities Over the next 30 days, patient will: . Complete, sign, and return prescription assistance application to [facility] . Call RN Care Manager as needed [phone] . Call PCP with any new or worsening symptoms [phone] . Purchase symbicort this month (or pickup samples of Dulera if available)    Follow Up Plan:  . Telephone follow up appointment with care management team member scheduled for: 10/11/20 with LCSW . RN will follow-up over the next 30 days . The patient has been provided with contact information for the care management team and  has been advised to call with any health related questions or concerns.  . Next PCP appointment scheduled for: Needs scheduling . GI appointment scheduled for: 10/12/20 with Roseanne Kaufman, NP   Chong Sicilian, BSN, RN-BC [place] [facility] [place] Management Direct Dial: [phone]

## 2020-09-20 NOTE — Telephone Encounter (Signed)
09/20/2020  I talked with Mr Rehfeld today about his medications and financial constraints.   Symbicort is going to be $47 with insurance. He'll try to purchase that this month.   I can work on a prescription Media planner for [employer] through [employer]. It is usually easy to get approved through them. It normally takes about a month to process once the application is in.    Are there any samples of Dulera or a comparable medication available for one month?  Forwarding to PCP for review and response.   Chong Sicilian, BSN, RN-BC Embedded Chronic Care Manager [facility] [place] Management Direct Dial: [phone]

## 2020-09-21 NOTE — Telephone Encounter (Signed)
Patient aware and verbalized understanding. °

## 2020-09-21 NOTE — Telephone Encounter (Signed)
We can give him a sample of the Trelegy to help until he can get his medications covered.

## 2020-10-11 ENCOUNTER — Telehealth: Payer: Medicare Other

## 2020-10-12 ENCOUNTER — Encounter: Payer: Self-pay | Admitting: Gastroenterology

## 2020-10-12 ENCOUNTER — Telehealth: Payer: Self-pay | Admitting: Gastroenterology

## 2020-10-12 ENCOUNTER — Other Ambulatory Visit: Payer: Self-pay

## 2020-10-12 ENCOUNTER — Ambulatory Visit (INDEPENDENT_AMBULATORY_CARE_PROVIDER_SITE_OTHER): Payer: Medicare Other | Admitting: Gastroenterology

## 2020-10-12 DIAGNOSIS — Z01818 Encounter for other preprocedural examination: Secondary | ICD-10-CM | POA: Diagnosis not present

## 2020-10-12 DIAGNOSIS — Z1211 Encounter for screening for malignant neoplasm of colon: Secondary | ICD-10-CM | POA: Diagnosis not present

## 2020-10-12 NOTE — Progress Notes (Signed)
Primary Care Physician:  Sharion Balloon, FNP  Referring Physician: Evelina Dun, FNP Primary Gastroenterologist:  Dr. Gala Romney  Chief Complaint  Patient presents with  . Colonoscopy    Never done prior    HPI:   Justin Gill is a 65 y.o. male presenting today at the request of Evelina Dun, [place], for initial screening colonoscopy. He was brought in due to need for Propofol in setting of chronic ETOH use.   No abdominal pain, N/V. No overt GI bleeding. Denies any changes in bowel habits. No constipation or diarrhea. Sometimes doesn't feel like eating. No weight loss. No dysphagia. Only indigestion with spicy foods but not often. No family history of colorectal cancer or polyps. Drinks 40 oz beer daily. No known liver disease.   Tbili 4.7 in April 2021. Other LFTs normal.   Past Medical History:  Diagnosis Date  . Arthritis   . Asthma   . Hypertension     Past Surgical History:  Procedure Laterality Date  . ORTHOPEDIC SURGERY    . PERCUTANEOUS PINNING Bilateral 09/22/2012   Procedure: Bilateral hands/I&D and Repair as Needed;  Surgeon: Linna Hoff, MD;  Location: [facility];  Service: Orthopedics;  Laterality: Bilateral;  . TONSILLECTOMY      Current Outpatient Medications  Medication Sig Dispense Refill  . albuterol (PROVENTIL HFA;VENTOLIN HFA) 108 (90 Base) MCG/ACT inhaler Inhale 2 puffs into the lungs every 6 (six) hours as needed for wheezing. 1 Inhaler 2  . amLODipine (NORVASC) 5 MG tablet Take 1 tablet (5 mg total) by mouth daily. 90 tablet 0  . budesonide-formoterol (SYMBICORT) 160-4.5 MCG/ACT inhaler Inhale 2 puffs into the lungs 2 (two) times daily. 1 each 3  . Cyanocobalamin (B-12 PO) Take 1 tablet by mouth daily.    Marland Kitchen ibuprofen (ADVIL) 800 MG tablet Take 1 tablet (800 mg total) by mouth every 8 (eight) hours as needed. 60 tablet 5  . Multiple Vitamin (MULTIVITAMIN WITH MINERALS) TABS Take 1 tablet by mouth daily.    . sildenafil (VIAGRA) 100 MG tablet Take 1 tablet  (100 mg total) by mouth as needed for erectile dysfunction. 30 tablet 1  . Thiamine HCl (B-1 PO) Take 1 tablet by mouth daily.    Marland Kitchen triamcinolone (KENALOG) 0.1 % Apply topically 2 (two) times daily. (Patient taking differently: Apply topically 2 (two) times daily. As needed) 80 g 0   No current facility-administered medications for this visit.    Allergies as of 10/12/2020 - Review Complete 10/12/2020  Allergen Reaction Noted  . Bee venom  12/25/2012    Family History  Problem Relation Age of Onset  . Diabetes Mother   . Heart disease Mother   . Hyperlipidemia Mother   . Hypertension Mother   . Colon cancer Neg Hx   . Colon polyps Neg Hx     Social History   Socioeconomic History  . Marital status: Divorced    Spouse name: Not on file  . Number of children: Not on file  . Years of education: Not on file  . Highest education level: Not on file  Occupational History  . Not on file  Tobacco Use  . Smoking status: Current Every Day Smoker    Packs/day: 0.50    Years: 20.00    Pack years: 10.00    Types: Cigarettes  . Smokeless tobacco: Never Used  Vaping Use  . Vaping Use: Never used  Substance and Sexual Activity  . Alcohol use: Yes  Comment: 40 ounces per day  . Drug use: No  . Sexual activity: Not on file  Other Topics Concern  . Not on file  Social History Narrative  . Not on file   Social Determinants of Health   Financial Resource Strain: Not on file  Food Insecurity: Not on file  Transportation Needs: No Transportation Needs  . Lack of Transportation (Medical): No  . Lack of Transportation (Non-Medical): No  Physical Activity: Not on file  Stress: Not on file  Social Connections: Moderately Isolated  . Frequency of Communication with Friends and Family: More than three times a week  . Frequency of Social Gatherings with Friends and Family: More than three times a week  . Attends Religious Services: More than 4 times per year  . Active Member of  Clubs or Organizations: No  . Attends Archivist Meetings: Never  . Marital Status: Divorced  Human resources officer Violence: Not on file    Review of Systems: Gen: Denies any fever, chills, fatigue, weight loss, lack of appetite.  CV: Denies chest pain, heart palpitations, peripheral edema, syncope.  Resp: Denies shortness of breath at rest or with exertion. Denies wheezing or cough.  GI: see HPI GU : Denies urinary burning, urinary frequency, urinary hesitancy MS: Denies joint pain, muscle weakness, cramps, or limitation of movement.  Derm: Denies rash, itching, dry skin Psych: Denies depression, anxiety, memory loss, and confusion Heme: Denies bruising, bleeding, and enlarged lymph nodes.  Physical Exam: BP (!) 148/96   Pulse 80   Temp 97.7 F (36.5 C)   Ht 5\' 11"  (1.803 m)   Wt 183 lb 12.8 oz (83.4 kg)   BMI 25.63 kg/m  General:   Alert and oriented. Pleasant and cooperative. Well-nourished and well-developed.  Head:  Normocephalic and atraumatic. Eyes:  Without icterus, sclera clear and conjunctiva pink.  Ears:  Normal auditory acuity. Mouth:  Mask in place Lungs:  Clear to auscultation bilaterally. No wheezes, rales, or rhonchi. No distress.  Heart:  S1, S2 present without murmurs appreciated.  Abdomen:  +BS, soft, non-tender and non-distended. No HSM noted. No guarding or rebound. No masses appreciated.  Rectal:  Deferred  Msk:  Symmetrical without gross deformities. Normal posture. Extremities:  Without edema. Neurologic:  Alert and  oriented x4;  grossly normal neurologically. Skin:  Intact without significant lesions or rashes. Psych:  Alert and cooperative. Normal mood and affect.  ASSESSMENT: Justin Gill is a 65 y.o. male presenting today with need for initial screening colonoscopy, without any concerning upper or lower GI signs/symptoms. No family history of colorectal cancer or polyps.  He does have a history of chronic ETOH use, drinking 40 oz beer  daily. I note in April 2021 his bilirubin was 4.7, with remaining LFTs normal. We will update this now.    PLAN: Proceed with colonoscopy by Dr. Gala Romney in near future using Propofol: the risks, benefits, and alternatives have been discussed with the patient in detail. The patient states understanding and desires to proceed.  Recheck HFP and CBC  Recommend ETOH cessation  Further recommendations to follow  Annitta Needs, PhD, ANP-BC [facility]

## 2020-10-12 NOTE — Telephone Encounter (Signed)
Justin Gill,  I was reviewing chart and saw elevated bilirubin last year. CBC was also interesting with elevated Hgb.   Can we have patient complete a CBC with diff and CMP? Thanks!

## 2020-10-12 NOTE — Patient Instructions (Signed)
We are arranging a colonoscopy in the near future with Dr. Gala Romney.  Further recommendations to follow!  It was a pleasure to see you today. I want to create trusting relationships with patients to provide genuine, compassionate, and quality care. I value your feedback. If you receive a survey regarding your visit,  I greatly appreciate you taking time to fill this out.   Annitta Needs, PhD, ANP-BC [facility]

## 2020-10-15 ENCOUNTER — Other Ambulatory Visit: Payer: Self-pay

## 2020-10-15 MED ORDER — PEG 3350-KCL-NA BICARB-NACL 420 G PO SOLR: 4000.0000 mL | ORAL | 0 refills | Status: DC

## 2020-10-15 NOTE — Progress Notes (Signed)
Cc'ed to pcp °

## 2020-10-16 ENCOUNTER — Other Ambulatory Visit: Payer: Self-pay

## 2020-10-16 DIAGNOSIS — Z1211 Encounter for screening for malignant neoplasm of colon: Secondary | ICD-10-CM

## 2020-10-16 DIAGNOSIS — Z79899 Other long term (current) drug therapy: Secondary | ICD-10-CM

## 2020-10-16 NOTE — Telephone Encounter (Signed)
Lmom, waiting on a return call.  

## 2020-10-16 NOTE — Telephone Encounter (Signed)
Pt returned call and is aware that he needs lab work completed per Roseanne Kaufman, NP. Per pt, he would like lab orders mailed to him. Orders placed ad mailed to pt.

## 2020-11-07 ENCOUNTER — Telehealth: Payer: Medicare Other

## 2020-11-15 ENCOUNTER — Other Ambulatory Visit: Payer: Self-pay

## 2020-11-15 ENCOUNTER — Other Ambulatory Visit: Payer: Medicare Other

## 2020-11-15 DIAGNOSIS — Z79899 Other long term (current) drug therapy: Secondary | ICD-10-CM | POA: Diagnosis not present

## 2020-11-15 DIAGNOSIS — Z1211 Encounter for screening for malignant neoplasm of colon: Secondary | ICD-10-CM | POA: Diagnosis not present

## 2020-11-16 LAB — COMPREHENSIVE METABOLIC PANEL
ALT: 23 IU/L (ref 0–44)
AST: 18 IU/L (ref 0–40)
Albumin/Globulin Ratio: 1.4 (ref 1.2–2.2)
Albumin: 4.4 g/dL (ref 3.8–4.8)
Alkaline Phosphatase: 73 IU/L (ref 44–121)
BUN/Creatinine Ratio: 8 — ABNORMAL LOW (ref 10–24)
BUN: 7 mg/dL — ABNORMAL LOW (ref 8–27)
Bilirubin Total: 1.8 mg/dL — ABNORMAL HIGH (ref 0.0–1.2)
CO2: 22 mmol/L (ref 20–29)
Calcium: 9.5 mg/dL (ref 8.6–10.2)
Chloride: 100 mmol/L (ref 96–106)
Creatinine, Ser: 0.91 mg/dL (ref 0.76–1.27)
Globulin, Total: 3.1 g/dL (ref 1.5–4.5)
Glucose: 95 mg/dL (ref 65–99)
Potassium: 4.6 mmol/L (ref 3.5–5.2)
Sodium: 140 mmol/L (ref 134–144)
Total Protein: 7.5 g/dL (ref 6.0–8.5)
eGFR: 93 mL/min/{1.73_m2} (ref >59)

## 2020-11-16 LAB — CBC WITH DIFFERENTIAL/PLATELET
Basophils Absolute: 0.1 10*3/uL (ref 0.0–0.2)
Basos: 2 %
EOS (ABSOLUTE): 0.3 10*3/uL (ref 0.0–0.4)
Eos: 6 %
Hematocrit: 57.5 % — ABNORMAL HIGH (ref 37.5–51.0)
Hemoglobin: 20 g/dL (ref 13.0–17.7)
Immature Grans (Abs): 0 10*3/uL (ref 0.0–0.1)
Immature Granulocytes: 1 %
Lymphocytes Absolute: 1.3 10*3/uL (ref 0.7–3.1)
Lymphs: 33 %
MCH: 31.9 pg (ref 26.6–33.0)
MCHC: 34.8 g/dL (ref 31.5–35.7)
MCV: 92 fL (ref 79–97)
Monocytes Absolute: 0.5 10*3/uL (ref 0.1–0.9)
Monocytes: 11 %
Neutrophils Absolute: 1.9 10*3/uL (ref 1.4–7.0)
Neutrophils: 47 %
Platelets: 252 10*3/uL (ref 150–450)
RBC: 6.26 x10E6/uL — ABNORMAL HIGH (ref 4.14–5.80)
RDW: 13.9 % (ref 11.6–15.4)
WBC: 4 10*3/uL (ref 3.4–10.8)

## 2020-11-19 ENCOUNTER — Other Ambulatory Visit: Payer: Self-pay

## 2020-11-19 DIAGNOSIS — Z79899 Other long term (current) drug therapy: Secondary | ICD-10-CM

## 2020-11-19 DIAGNOSIS — D582 Other hemoglobinopathies: Secondary | ICD-10-CM

## 2020-11-27 ENCOUNTER — Other Ambulatory Visit (HOSPITAL_COMMUNITY)
Admission: RE | Admit: 2020-11-27 | Discharge: 2020-11-27 | Disposition: A | Discharge status: Home / Self Care | Payer: Medicare Other | Source: Ambulatory Visit | Attending: Internal Medicine | Admitting: Internal Medicine

## 2020-11-27 ENCOUNTER — Other Ambulatory Visit: Payer: Self-pay

## 2020-11-27 DIAGNOSIS — Z01812 Encounter for preprocedural laboratory examination: Secondary | ICD-10-CM | POA: Insufficient documentation

## 2020-11-27 DIAGNOSIS — Z20822 Contact with and (suspected) exposure to covid-19: Secondary | ICD-10-CM | POA: Diagnosis not present

## 2020-11-28 LAB — SARS CORONAVIRUS 2 (TAT 6-24 HRS): SARS Coronavirus 2: NEGATIVE

## 2020-11-29 ENCOUNTER — Encounter (HOSPITAL_COMMUNITY)
Admission: RE | Disposition: A | Discharge status: Home / Self Care | Payer: Self-pay | Source: Home / Self Care | Attending: Internal Medicine

## 2020-11-29 ENCOUNTER — Encounter (HOSPITAL_COMMUNITY): Payer: Self-pay | Admitting: Internal Medicine

## 2020-11-29 ENCOUNTER — Ambulatory Visit (HOSPITAL_COMMUNITY)
Admission: RE | Admit: 2020-11-29 | Discharge: 2020-11-29 | Disposition: A | Discharge status: Home / Self Care | Payer: Medicare Other | Attending: Internal Medicine | Admitting: Internal Medicine

## 2020-11-29 ENCOUNTER — Ambulatory Visit (HOSPITAL_COMMUNITY): Payer: Medicare Other | Admitting: Anesthesiology

## 2020-11-29 ENCOUNTER — Other Ambulatory Visit: Payer: Self-pay

## 2020-11-29 DIAGNOSIS — Z7951 Long term (current) use of inhaled steroids: Secondary | ICD-10-CM | POA: Insufficient documentation

## 2020-11-29 DIAGNOSIS — K621 Rectal polyp: Secondary | ICD-10-CM

## 2020-11-29 DIAGNOSIS — Z91030 Bee allergy status: Secondary | ICD-10-CM | POA: Insufficient documentation

## 2020-11-29 DIAGNOSIS — D123 Benign neoplasm of transverse colon: Secondary | ICD-10-CM | POA: Diagnosis not present

## 2020-11-29 DIAGNOSIS — Z79899 Other long term (current) drug therapy: Secondary | ICD-10-CM | POA: Insufficient documentation

## 2020-11-29 DIAGNOSIS — D128 Benign neoplasm of rectum: Secondary | ICD-10-CM | POA: Insufficient documentation

## 2020-11-29 DIAGNOSIS — D120 Benign neoplasm of cecum: Secondary | ICD-10-CM | POA: Diagnosis not present

## 2020-11-29 DIAGNOSIS — F17210 Nicotine dependence, cigarettes, uncomplicated: Secondary | ICD-10-CM | POA: Diagnosis not present

## 2020-11-29 DIAGNOSIS — Z1211 Encounter for screening for malignant neoplasm of colon: Secondary | ICD-10-CM | POA: Diagnosis not present

## 2020-11-29 DIAGNOSIS — D124 Benign neoplasm of descending colon: Secondary | ICD-10-CM | POA: Diagnosis not present

## 2020-11-29 DIAGNOSIS — K635 Polyp of colon: Secondary | ICD-10-CM | POA: Diagnosis not present

## 2020-11-29 DIAGNOSIS — J449 Chronic obstructive pulmonary disease, unspecified: Secondary | ICD-10-CM | POA: Diagnosis not present

## 2020-11-29 HISTORY — PX: COLONOSCOPY WITH PROPOFOL: SHX5780

## 2020-11-29 HISTORY — PX: POLYPECTOMY: SHX5525

## 2020-11-29 SURGERY — COLONOSCOPY WITH PROPOFOL
Anesthesia: General

## 2020-11-29 MED ORDER — LACTATED RINGERS IV SOLN: INTRAVENOUS | Status: DC

## 2020-11-29 MED ORDER — PROPOFOL 10 MG/ML IV BOLUS
INTRAVENOUS | Status: DC | PRN
  Administered 2020-11-29: 20 mg via INTRAVENOUS
  Administered 2020-11-29: 50 mg via INTRAVENOUS
  Administered 2020-11-29: 20 mg via INTRAVENOUS

## 2020-11-29 MED ORDER — STERILE WATER FOR IRRIGATION IR SOLN
Status: DC | PRN
  Administered 2020-11-29: 100 mL

## 2020-11-29 MED ORDER — PROPOFOL 500 MG/50ML IV EMUL
INTRAVENOUS | Status: DC | PRN
  Administered 2020-11-29: 150 ug/kg/min via INTRAVENOUS

## 2020-11-29 NOTE — Discharge Instructions (Signed)
  Colonoscopy Discharge Instructions  Read the instructions outlined below and refer to this sheet in the next few weeks. These discharge instructions provide you with general information on caring for yourself after you leave the hospital. Your doctor may also give you specific instructions. While your treatment has been planned according to the most current medical practices available, unavoidable complications occasionally occur. If you have any problems or questions after discharge, call Dr. Gala Romney at [phone]. ACTIVITY  You may resume your regular activity, but move at a slower pace for the next 24 hours.   Take frequent rest periods for the next 24 hours.   Walking will help get rid of the air and reduce the bloated feeling in your belly (abdomen).   No driving for 24 hours (because of the medicine (anesthesia) used during the test).    Do not sign any important legal documents or operate any machinery for 24 hours (because of the anesthesia used during the test).  NUTRITION  Drink plenty of fluids.   You may resume your normal diet as instructed by your doctor.   Begin with a light meal and progress to your normal diet. Heavy or fried foods are harder to digest and may make you feel sick to your stomach (nauseated).   Avoid alcoholic beverages for 24 hours or as instructed.  MEDICATIONS  You may resume your normal medications unless your doctor tells you otherwise.  WHAT YOU CAN EXPECT TODAY  Some feelings of bloating in the abdomen.   Passage of more gas than usual.   Spotting of blood in your stool or on the toilet paper.  IF YOU HAD POLYPS REMOVED DURING THE COLONOSCOPY:  No aspirin products for 7 days or as instructed.   No alcohol for 7 days or as instructed.   Eat a soft diet for the next 24 hours.  FINDING OUT THE RESULTS OF YOUR TEST Not all test results are available during your visit. If your test results are not back during the visit, make an appointment  with your caregiver to find out the results. Do not assume everything is normal if you have not heard from your caregiver or the medical facility. It is important for you to follow up on all of your test results.  SEEK IMMEDIATE MEDICAL ATTENTION IF:  You have more than a spotting of blood in your stool.   Your belly is swollen (abdominal distention).   You are nauseated or vomiting.   You have a temperature over 101.   You have abdominal pain or discomfort that is severe or gets worse throughout the day.   12 polyps removed in your colon today  Further recommendations to follow pending review of pathology report  EKG to be done today as changes compared to the last 1 done in 2014  Your hematocrit and hemoglobin are elevated, further evaluation with Evelina Dun recommended regarding blood work abnormalities and abnormal EKG   At patient request, I called Gregary Cromer at [phone] and reviewed results and recommendations

## 2020-11-29 NOTE — Addendum Note (Signed)
Addendum  created 11/29/20 0909 by Denese Killings, MD   Clinical Note Signed

## 2020-11-29 NOTE — Anesthesia Preprocedure Evaluation (Signed)
Anesthesia Evaluation  Patient identified by MRN, date of birth, ID band Patient awake    Reviewed: Allergy & Precautions, NPO status , Patient's Chart, lab work & pertinent test results  History of Anesthesia Complications Negative for: history of anesthetic complications  Airway Mallampati: II  TM Distance: >3 FB Neck ROM: Full    Dental  (+) Dental Advisory Given, Missing, Chipped   Pulmonary asthma , COPD,  COPD inhaler, Current Smoker and Patient abstained from smoking.,    Pulmonary exam normal breath sounds clear to auscultation       Cardiovascular hypertension, Pt. on medications Normal cardiovascular exam Rhythm:Regular Rate:Normal     Neuro/Psych negative neurological ROS  negative psych ROS   GI/Hepatic negative GI ROS, (+)     substance abuse (40 ounce beer/day)  alcohol use,   Endo/Other    Renal/GU      Musculoskeletal  (+) Arthritis , Osteoarthritis,    Abdominal   Peds  Hematology  (+) Blood dyscrasia (elevated RBC, H/Hct 20/57.5), ,   Anesthesia Other Findings   Reproductive/Obstetrics                             Anesthesia Physical Anesthesia Plan  ASA: III  Anesthesia Plan: General   Post-op Pain Management:    Induction: Intravenous  PONV Risk Score and Plan: Propofol infusion  Airway Management Planned: Nasal Cannula and Natural Airway  Additional Equipment:   Intra-op Plan:   Post-operative Plan:   Informed Consent: I have reviewed the patients History and Physical, chart, labs and discussed the procedure including the risks, benefits and alternatives for the proposed anesthesia with the patient or authorized representative who has indicated his/her understanding and acceptance.     Dental advisory given  Plan Discussed with: CRNA and Surgeon  Anesthesia Plan Comments:         Anesthesia Quick Evaluation

## 2020-11-29 NOTE — Transfer of Care (Signed)
Immediate Anesthesia Transfer of Care Note  Patient: Justin Gill  Procedure(s) Performed: COLONOSCOPY WITH PROPOFOL (N/A ) POLYPECTOMY  Patient Location: PACU  Anesthesia Type:General  Level of Consciousness: awake, alert , oriented and patient cooperative  Airway & Oxygen Therapy: Patient Spontanous Breathing  Post-op Assessment: Report given to RN, Post -op Vital signs reviewed and stable and Patient moving all extremities X 4  Post vital signs: Reviewed and stable  Last Vitals:  Vitals Value Taken Time  BP    Temp    Pulse    Resp    SpO2      Last Pain:  Vitals:   11/29/20 0801  TempSrc:   PainSc: 0-No pain      Patients Stated Pain Goal: 4 (67/20/94 7096)  Complications: No complications documented.

## 2020-11-29 NOTE — H&P (Signed)
@LOGO @   Primary Care Physician:  Sharion Balloon, FNP Primary Gastroenterologist:  Dr. Gala Romney  Pre-Procedure History & Physical: HPI:  Justin Gill is a 65 y.o. male is here for a screening colonoscopy.  First ever average risk screening examination.  No bowel symptoms.  Past Medical History:  Diagnosis Date  . Arthritis   . Asthma   . Hypertension     Past Surgical History:  Procedure Laterality Date  . ORTHOPEDIC SURGERY    . PERCUTANEOUS PINNING Bilateral 09/22/2012   Procedure: Bilateral hands/I&D and Repair as Needed;  Surgeon: Linna Hoff, MD;  Location: [facility];  Service: Orthopedics;  Laterality: Bilateral;  . TONSILLECTOMY      Prior to Admission medications   Medication Sig Start Date End Date Taking? Authorizing Provider  amLODipine (NORVASC) 5 MG tablet Take 1 tablet (5 mg total) by mouth daily. 08/31/20  Yes Hawks, Christy A, FNP  budesonide-formoterol (SYMBICORT) 160-4.5 MCG/ACT inhaler Inhale 2 puffs into the lungs 2 (two) times daily. 08/31/20  Yes Hawks, Christy A, FNP  Cyanocobalamin (B-12 PO) Take 1 tablet by mouth daily.   Yes [provider]  diphenhydrAMINE (BENADRYL) 25 MG tablet Take 25 mg by mouth every 6 (six) hours as needed.   Yes [provider]  ibuprofen (ADVIL) 800 MG tablet Take 1 tablet (800 mg total) by mouth every 8 (eight) hours as needed. Patient taking differently: Take 800 mg by mouth every 8 (eight) hours as needed for mild pain or moderate pain. 08/31/20  Yes Hawks, Christy A, FNP  Multiple Vitamin (MULTIVITAMIN WITH MINERALS) TABS Take 1 tablet by mouth daily.   Yes [provider]  pyridOXINE (VITAMIN B-6) 100 MG tablet Take 100 mg by mouth daily.   Yes [provider]  triamcinolone (KENALOG) 0.1 % Apply topically 2 (two) times daily. Patient taking differently: Apply 1 application topically daily as needed (Rash). 08/31/20  Yes Hawks, Christy A, FNP  albuterol (PROVENTIL HFA;VENTOLIN HFA) 108 (90 Base)  MCG/ACT inhaler Inhale 2 puffs into the lungs every 6 (six) hours as needed for wheezing. 05/01/16   Timmothy Euler, MD  polyethylene glycol-electrolytes (TRILYTE) 420 g solution Take 4,000 mLs by mouth as directed. 10/15/20   Elaya Droege, Cristopher Estimable, MD  sildenafil (VIAGRA) 100 MG tablet Take 1 tablet (100 mg total) by mouth as needed for erectile dysfunction. Patient taking differently: Take 100 mg by mouth daily as needed for erectile dysfunction. 08/31/20   Sharion Balloon, FNP    Allergies as of 10/15/2020 - Review Complete 10/12/2020  Allergen Reaction Noted  . Bee venom  12/25/2012    Family History  Problem Relation Age of Onset  . Diabetes Mother   . Heart disease Mother   . Hyperlipidemia Mother   . Hypertension Mother   . Colon cancer Neg Hx   . Colon polyps Neg Hx     Social History   Socioeconomic History  . Marital status: Divorced    Spouse name: Not on file  . Number of children: Not on file  . Years of education: Not on file  . Highest education level: Not on file  Occupational History  . Not on file  Tobacco Use  . Smoking status: Current Every Day Smoker    Packs/day: 0.50    Years: 20.00    Pack years: 10.00    Types: Cigarettes  . Smokeless tobacco: Never Used  Vaping Use  . Vaping Use: Never used  Substance and Sexual Activity  .  Alcohol use: Yes    Comment: 40 ounces per day  . Drug use: No  . Sexual activity: Not on file  Other Topics Concern  . Not on file  Social History Narrative  . Not on file   Social Determinants of Health   Financial Resource Strain: Not on file  Food Insecurity: Not on file  Transportation Needs: No Transportation Needs  . Lack of Transportation (Medical): No  . Lack of Transportation (Non-Medical): No  Physical Activity: Not on file  Stress: Not on file  Social Connections: Moderately Isolated  . Frequency of Communication with Friends and Family: More than three times a week  . Frequency of Social Gatherings with  Friends and Family: More than three times a week  . Attends Religious Services: More than 4 times per year  . Active Member of Clubs or Organizations: No  . Attends Archivist Meetings: Never  . Marital Status: Divorced  Human resources officer Violence: Not on file    Review of Systems: See HPI, otherwise negative ROS  Physical Exam: BP (!) 175/109   Pulse 79   Temp 97.8 F (36.6 C) (Oral)   Resp (!) 21   Ht 5\' 11"  (1.803 m)   Wt 81.6 kg   SpO2 96%   BMI 25.10 kg/m  General:   Alert,  Well-developed, well-nourished, pleasant and cooperative in NAD Neck:  Supple; no masses or thyromegaly. Lungs:  Clear throughout to auscultation.   No wheezes, crackles, or rhonchi. No acute distress. Heart:  Regular rate and rhythm; no murmurs, clicks, rubs,  or gallops. Abdomen:  Soft, nontender and nondistended. No masses, hepatosplenomegaly or hernias noted. Normal bowel sounds, without guarding, and without rebound.    Impression/Plan: Justin Gill is now here to undergo a screening colonoscopy.  First ever average risk screening examination Risks, benefits, limitations, imponderables and alternatives regarding colonoscopy have been reviewed with the patient. Questions have been answered. All parties agreeable.  Elevated hematocrit and hemoglobin noted on recent labs.  Further evaluation recommended.    Notice:  This dictation was prepared with Dragon dictation along with smaller phrase technology. Any transcriptional errors that result from this process are unintentional and may not be corrected upon review.

## 2020-11-29 NOTE — Anesthesia Postprocedure Evaluation (Addendum)
Anesthesia Post Note  Patient: Justin Gill  Procedure(s) Performed: COLONOSCOPY WITH PROPOFOL (N/A ) POLYPECTOMY  Patient location during evaluation: Phase II Anesthesia Type: General Level of consciousness: awake, awake and alert and patient cooperative Pain management: satisfactory to patient Vital Signs Assessment: post-procedure vital signs reviewed and stable Respiratory status: spontaneous breathing, patient connected to nasal cannula oxygen and nonlabored ventilation Cardiovascular status: stable Postop Assessment: adequate PO intake Anesthetic complications: no Comments: Reviewed EKG, last EKG 8 years ago, new EKG changes compared to 8 years ago,  currently asymptomatic, advised to go to his primary care team as soon as possible.   No complications documented.   Last Vitals:  Vitals:   11/29/20 0717  BP: (!) 175/109  Pulse: 79  Resp: (!) 21  Temp: 36.6 C  SpO2: 96%    Last Pain:  Vitals:   11/29/20 0801  TempSrc:   PainSc: 0-No pain                 GREGORY,SUZANNE

## 2020-11-29 NOTE — Op Note (Addendum)
[facility] Patient Name: Justin Gill Procedure Date: 11/29/2020 7:41 AM MRN: [id number] Date of Birth: 1955-10-11 Attending MD: Norvel Richards , MD CSN: [id number] Age: 65 Admit Type: Outpatient Procedure:                Colonoscopy Indications:              Screening for colorectal malignant neoplasm Providers:                Norvel Richards, MD, Rosina Lowenstein, RN, Aram Candela Referring MD:              Medicines:                Propofol per Anesthesia Complications:            No immediate complications. Estimated Blood Loss:     Estimated blood loss was minimal. Procedure:                Pre-Anesthesia Assessment:                           - Prior to the procedure, a History and Physical                            was performed, and patient medications and                            allergies were reviewed. The patient's tolerance of                            previous anesthesia was also reviewed. The risks                            and benefits of the procedure and the sedation                            options and risks were discussed with the patient.                            All questions were answered, and informed consent                            was obtained. Prior Anticoagulants: The patient has                            taken no previous anticoagulant or antiplatelet                            agents. ASA Grade Assessment: II - A patient with                            mild systemic disease. After reviewing the risks  and benefits, the patient was deemed in                            satisfactory condition to undergo the procedure.                           After obtaining informed consent, the colonoscope                            was passed under direct vision. Throughout the                            procedure, the patient's blood pressure, pulse, and                            oxygen  saturations were monitored continuously. The                            [id number]-[id number] ([id number]) scope was introduced through                            the anus and advanced to the the cecum, identified                            by appendiceal orifice and ileocecal valve. The                            colonoscopy was performed without difficulty. The                            patient tolerated the procedure well. The quality                            of the bowel preparation was adequate. Scope In: 8:07:03 AM Scope Out: 8:33:58 AM Scope Withdrawal Time: 0 hours 18 minutes 53 seconds  Total Procedure Duration: 0 hours 26 minutes 55 seconds  Findings:      The perianal and digital rectal examinations were normal.      12 semi-pedunculated polyps were found in the rectum, proximal rectum,       descending colon, hepatic flexure and cecum. The polyps were 3 to 8 mm       in size. These polyps were removed with a cold snare. Resection and       retrieval were complete. Estimated blood loss was minimal.      The exam was otherwise without abnormality on direct and retroflexion       views. Impression:               - (12) 3 to 8 mm polyps in the rectum, in the                            proximal rectum, in the descending colon, at the                            hepatic flexure and in  the cecum, removed with a                            cold snare. Resected and retrieved.                           - The examination was otherwise normal on direct                            and retroflexion views. Moderate Sedation:      Moderate (conscious) sedation was personally administered by an       anesthesia professional. The following parameters were monitored: oxygen       saturation, heart rate, blood pressure, respiratory rate, EKG, adequacy       of pulmonary ventilation, and response to care. Recommendation:           - Patient has a contact number available for                             emergencies. The signs and symptoms of potential                            delayed complications were discussed with the                            patient. Return to normal activities tomorrow.                            Written discharge instructions were provided to the                            patient.                           - Resume previous diet.                           - Continue present medications.                           - Repeat colonoscopy date to be determined after                            pending pathology results are reviewed for                            surveillance.                           - Return to GI office (date not yet determined).                           -Outpatient labs demonstrated H&H of 20/30. Patient                            needs to follow-up with Evelina Dun regarding  evaluation of polycythemia. Also, ST elevation on                            cardiac monitor during procedure. No symptoms. We                            will obtain a EKG following the procedure. Last                            tracing 2014 on file - normal.                           Reviewed EKG. Markedly different than the one in                            2014. Has LVH and repolarization abnormalities.                            Again, patient completely asymptomatic without                            shortness of breath or chest pain. He is urged to                            follow-up with PCP. He would benefit from                            cardiology consultation. Procedure Code(s):        --- Professional ---                           [phone], Colonoscopy, flexible; with removal of                            tumor(s), polyp(s), or other lesion(s) by snare                            technique Diagnosis Code(s):        --- Professional ---                           Z12.11, Encounter for screening for malignant                             neoplasm of colon                           K62.1, Rectal polyp                           K63.5, Polyp of colon CPT copyright 2019 American Medical Association. All rights reserved. The codes documented in this report are preliminary and upon coder review may  be revised to meet current compliance requirements. Cristopher Estimable. Justin Bonet, MD Norvel Richards, MD 11/29/2020 8:50:38 AM This report has been signed electronically. Number of Addenda: 0

## 2020-11-30 ENCOUNTER — Encounter: Payer: Self-pay | Admitting: Internal Medicine

## 2020-11-30 ENCOUNTER — Encounter: Payer: Self-pay | Admitting: Family

## 2020-11-30 ENCOUNTER — Ambulatory Visit (INDEPENDENT_AMBULATORY_CARE_PROVIDER_SITE_OTHER): Payer: Medicare Other | Admitting: Family

## 2020-11-30 VITALS — BP 146/92 | HR 80 | Temp 98.2°F | Ht 71.0 in | Wt 183.6 lb

## 2020-11-30 DIAGNOSIS — F1020 Alcohol dependence, uncomplicated: Secondary | ICD-10-CM | POA: Insufficient documentation

## 2020-11-30 DIAGNOSIS — Z23 Encounter for immunization: Secondary | ICD-10-CM

## 2020-11-30 DIAGNOSIS — D582 Other hemoglobinopathies: Secondary | ICD-10-CM | POA: Diagnosis not present

## 2020-11-30 DIAGNOSIS — R9431 Abnormal electrocardiogram [ECG] [EKG]: Secondary | ICD-10-CM

## 2020-11-30 DIAGNOSIS — Z720 Tobacco use: Secondary | ICD-10-CM

## 2020-11-30 DIAGNOSIS — I10 Essential (primary) hypertension: Secondary | ICD-10-CM | POA: Diagnosis not present

## 2020-11-30 DIAGNOSIS — F1029 Alcohol dependence with unspecified alcohol-induced disorder: Secondary | ICD-10-CM | POA: Diagnosis not present

## 2020-11-30 LAB — SURGICAL PATHOLOGY

## 2020-11-30 MED ORDER — AMLODIPINE BESYLATE 10 MG PO TABS: 10.0000 mg | ORAL_TABLET | Freq: Every day | ORAL | 1 refills | Status: DC

## 2020-11-30 NOTE — Addendum Note (Signed)
Addended by: Brynda Peon F on: 11/30/2020 09:01 AM   Modules accepted: Orders

## 2020-11-30 NOTE — Progress Notes (Signed)
Subjective:    Patient ID: Justin Gill, male    DOB: 1955/11/29, 65 y.o.   MRN: [id number]  Chief Complaint  Patient presents with  . Heart Problem    EKG in from 11/30/20 and blood work    Pt presents to the office today to discuss abnormal EKG. He reports he had a colonoscopy yesterday and was told his EKG was abnormal and needed to be seen by his PCP.   Denies any chest pain, no edema or SOB. He reports he does get anxious and times and notices palpitations at that time.   His BP is elevated today, but states his mother died a year ago and is have anxiety about this.    He is a current smoker for 46 years and currently smoke 1/2 pack a day. He also drinks 40 oz of beer a day. He is not very active.  Hypertension This is a chronic problem. The current episode started more than 1 year ago. The problem has been resolved since onset. The problem is controlled. Pertinent negatives include no malaise/fatigue, peripheral edema or shortness of breath. Past treatments include calcium channel blockers. The current treatment provides mild improvement.  Nicotine Dependence Presents for follow-up visit. His urge triggers include company of smokers. The symptoms have been stable. He smokes < 1/2 a pack of cigarettes per day.      Review of Systems  Constitutional: Negative for malaise/fatigue.  Respiratory: Negative for shortness of breath.        Objective:   Physical Exam Vitals reviewed.  Constitutional:      General: He is not in acute distress.    Appearance: He is well-developed.  HENT:     Head: Normocephalic.     Right Ear: Tympanic membrane normal.     Left Ear: Tympanic membrane normal.  Eyes:     General:        Right eye: No discharge.        Left eye: No discharge.     Pupils: Pupils are equal, round, and reactive to light.  Neck:     Thyroid: No thyromegaly.  Cardiovascular:     Rate and Rhythm: Normal rate and regular rhythm.     Heart sounds: Normal heart  sounds. No murmur heard.   Pulmonary:     Effort: Pulmonary effort is normal. No respiratory distress.     Breath sounds: Normal breath sounds. No wheezing.  Abdominal:     General: Bowel sounds are normal. There is no distension.     Palpations: Abdomen is soft.     Tenderness: There is no abdominal tenderness.  Musculoskeletal:        General: No tenderness. Normal range of motion.     Cervical back: Normal range of motion and neck supple.  Skin:    General: Skin is warm and dry.     Findings: No erythema or rash.  Neurological:     Mental Status: He is alert and oriented to person, place, and time.     Cranial Nerves: No cranial nerve deficit.     Deep Tendon Reflexes: Reflexes are normal and symmetric.  Psychiatric:        Behavior: Behavior normal.        Thought Content: Thought content normal.        Judgment: Judgment normal.          BP (!) 146/92   Pulse 80   Temp 98.2 F (36.8 C) (Temporal)  Ht 5\' 11"  (1.803 m)   Wt 183 lb 9.6 oz (83.3 kg)   BMI 25.61 kg/m   Assessment & Plan:  Justin Gill comes in today with chief complaint of Heart Problem (EKG in from 11/30/20 and blood work )   Diagnosis and orders addressed:  1. Primary hypertension Will increase to Norvasc 10 mg from 5 mg -Dash diet information given -Exercise encouraged - Stress Management  -Continue current meds -RTO in 2 weeks  - Ambulatory referral to Cardiology - amLODipine (NORVASC) 10 MG tablet; Take 1 tablet (10 mg total) by mouth daily.  Dispense: 90 tablet; Refill: 1  2. Tobacco abuse - Ambulatory referral to Cardiology  3. Alcohol dependence with unspecified alcohol-induced disorder [facility]) - Ambulatory referral to Cardiology  4. Abnormal EKG - Ambulatory referral to Cardiology  5. Elevated hemoglobin ([place]) - Ambulatory referral to Hematology / Oncology   Labs pending Health Maintenance reviewed Diet and exercise encouraged  Follow up plan: 2 weeks to recheck  HTN   Evelina Dun, FNP

## 2020-12-05 ENCOUNTER — Encounter (HOSPITAL_COMMUNITY): Payer: Self-pay | Admitting: Internal Medicine

## 2020-12-05 ENCOUNTER — Other Ambulatory Visit: Payer: Self-pay

## 2020-12-05 DIAGNOSIS — D582 Other hemoglobinopathies: Secondary | ICD-10-CM

## 2020-12-05 DIAGNOSIS — Z79899 Other long term (current) drug therapy: Secondary | ICD-10-CM

## 2020-12-12 ENCOUNTER — Ambulatory Visit (INDEPENDENT_AMBULATORY_CARE_PROVIDER_SITE_OTHER): Payer: Medicare Other | Admitting: Licensed Clinical Social Worker

## 2020-12-12 DIAGNOSIS — J449 Chronic obstructive pulmonary disease, unspecified: Secondary | ICD-10-CM

## 2020-12-12 DIAGNOSIS — G8929 Other chronic pain: Secondary | ICD-10-CM

## 2020-12-12 DIAGNOSIS — N529 Male erectile dysfunction, unspecified: Secondary | ICD-10-CM

## 2020-12-12 DIAGNOSIS — I10 Essential (primary) hypertension: Secondary | ICD-10-CM | POA: Diagnosis not present

## 2020-12-12 DIAGNOSIS — M25562 Pain in left knee: Secondary | ICD-10-CM

## 2020-12-12 DIAGNOSIS — Z720 Tobacco use: Secondary | ICD-10-CM

## 2020-12-12 NOTE — Chronic Care Management (AMB) (Signed)
Chronic Care Management    Clinical Social Work Note  12/12/2020 Name: Justin Gill MRN: [id number] DOB: July 17, 1956  Justin Gill is a 65 y.o. year old male who is a primary care patient of Sharion Balloon, FNP. The CCM team was consulted to assist the patient with chronic disease management and/or care coordination needs related to: [employer] .   Engaged with patient by telephone for follow up visit in response to provider referral for social work chronic care management and care coordination services.   Consent to Services:  The patient was given information about Chronic Care Management services, agreed to services, and gave verbal consent prior to initiation of services.  Please see initial visit note for detailed documentation.   Patient agreed to services and consent obtained.   Assessment: Review of patient past medical history, allergies, medications, and health status, including review of relevant consultants reports was performed today as part of a comprehensive evaluation and provision of chronic care management and care coordination services.     SDOH (Social Determinants of Health) assessments and interventions performed:  SDOH Interventions   Flowsheet Row Most Recent Value  SDOH Interventions   Depression Interventions/Treatment  --  [informed client of LCSW support and of RNCM support]       Advanced Directives Status: See Vynca application for related entries.  CCM Care Plan  Allergies  Allergen Reactions  . Bee Venom Hives and Swelling    Outpatient Encounter Medications as of 12/12/2020  Medication Sig  . albuterol (PROVENTIL HFA;VENTOLIN HFA) 108 (90 Base) MCG/ACT inhaler Inhale 2 puffs into the lungs every 6 (six) hours as needed for wheezing.  Marland Kitchen amLODipine (NORVASC) 10 MG tablet Take 1 tablet (10 mg total) by mouth daily.  . budesonide-formoterol (SYMBICORT) 160-4.5 MCG/ACT inhaler Inhale 2 puffs into the lungs 2 (two) times daily.  .  Cyanocobalamin (B-12 PO) Take 1 tablet by mouth daily.  . diphenhydrAMINE (BENADRYL) 25 MG tablet Take 25 mg by mouth every 6 (six) hours as needed.  Marland Kitchen ibuprofen (ADVIL) 800 MG tablet Take 1 tablet (800 mg total) by mouth every 8 (eight) hours as needed. (Patient taking differently: Take 800 mg by mouth every 8 (eight) hours as needed for mild pain or moderate pain.)  . Multiple Vitamin (MULTIVITAMIN WITH MINERALS) TABS Take 1 tablet by mouth daily.  . polyethylene glycol-electrolytes (TRILYTE) 420 g solution Take 4,000 mLs by mouth as directed.  . pyridOXINE (VITAMIN B-6) 100 MG tablet Take 100 mg by mouth daily.  . sildenafil (VIAGRA) 100 MG tablet Take 1 tablet (100 mg total) by mouth as needed for erectile dysfunction. (Patient taking differently: Take 100 mg by mouth daily as needed for erectile dysfunction.)  . triamcinolone (KENALOG) 0.1 % Apply topically 2 (two) times daily. (Patient taking differently: Apply 1 application topically daily as needed (Rash).)   No facility-administered encounter medications on file as of 12/12/2020.    Patient Active Problem List   Diagnosis Date Noted  . Alcohol dependence with unspecified alcohol-induced disorder ([place]) 11/30/2020  . HTN (hypertension) 04/19/2015  . COPD, moderate ([place]) 04/19/2015  . Tobacco abuse 04/19/2015  . Erectile dysfunction 04/19/2015    Conditions to be addressed/monitored: Monitor client management of transport needs, food needs and medication needs   Care Plan : LCSW: Community and Financial resources  Updates made by Katha Cabal, LCSW since 12/12/2020 12:00 AM    Problem: Coping Skills (General Plan of Care)     Goal: Coping Skills Enhanced: Client  needs information on community resources and financial resources   Start Date: 12/12/2020  Expected End Date: 03/14/2021  This Visit's Progress: Not on track  Recent Progress: Not on track  Priority: Medium  Note:   Current Barriers:  . Patient with mobility  challenges (arthritis) . Patient with medication procurement challenges (difficulty affording his medications) . Client has financial challenges . Client has dental needs . Decreased energy  Clinical Goal(s)  . Over the next 30 days , patient will communicate with LCSW to discuss community resources of possible help to client  . Client  will communicate as needed in next 30 days with RNCM to discuss nursing needs of client   Interventions provided by LCSW:  . Collaboration with Sharion Balloon, FNP regarding development and update of comprehensive plan of care as evidenced by provider attestation and co-signature . Assessed patient's care coordination needs related to mobility challenges, medication procurement issues and financial challenges of  client   . Talked with client about decreased energy . Talked with client about sleeping issues of client . Talked with client about [facility] Triage Nurse support and encouraged client to call Triage Nurse at [facility] as needed for nursing support . Talked with client about RNCM [place] and nursing support through [facility] program . Talked with client about food needs, transport needs and mobility challenges at home . Talked with client about upcoming client appointments . Talked with client about support from cardiologist (client said he has an appointment scheduled with cardiologist next Tuesday)  Patient Coping Skills: Completes ADLs as needed Attends scheduled medical appointments  Patient Deficits:  Mobility issues Pain issues Financial challenges Transport needs  Patient Goals:  Over the next 30 days, patient will:  - Acknowledge deficits and is motivated to resolve concern  - Contact RNCM or LCSW as needed to discuss client needs  Attend scheduled medical appointments Take prescribed medications as ordered by medical provider  Follow Up Plan: LCSW to call client on 01/22/21 to assess client needs at that time     Norva Riffle.Dandra Shambaugh  MSW, LCSW Licensed Clinical Social Worker [facility] Care Management [phone]

## 2020-12-12 NOTE — Patient Instructions (Signed)
Visit Information  PATIENT GOALS: Goals Addressed            This Visit's Progress   . Protect My Health;Manage Transport needs; Manage Financial needs; Manage medication needs       Timeframe:  Short-Term Goal Priority:  Medium Progress: Not on Track Start Date:                   12/12/20          Expected End Date:                 03/14/21      Follow Up Date 01/22/21   Protect My Health (Patient) Manage transport needs, medication needs and financial needs    Why is this important?    Screening tests can find diseases early when they are easier to treat.   Your doctor or nurse will talk with you about which tests are important for you.   Getting shots for common diseases like the flu and shingles will help prevent them.      Patient Coping Skills: Completes ADLs as needed Attends scheduled medical appointment  Patient Deficits:  Mobility issues Pain issues Financial challenges Transport needs  Patient Goals : Over the next 30 days, patient will:   - Acknowledge deficits and is motivated to resolve concern  - Contact RNCM or LCSW as needed to discuss client needs  Attend scheduled medical appointments Take prescribed medications as ordered by medical provider  Follow Up Plan: LCSW to call client on 01/22/21 to assess client needs at that time       Norva Riffle.Charnel Giles MSW, LCSW Licensed Clinical Social Worker [facility] Care Management [phone]

## 2020-12-14 ENCOUNTER — Other Ambulatory Visit: Payer: Self-pay

## 2020-12-14 ENCOUNTER — Ambulatory Visit (INDEPENDENT_AMBULATORY_CARE_PROVIDER_SITE_OTHER): Payer: Medicare Other | Admitting: Family

## 2020-12-14 ENCOUNTER — Encounter: Payer: Self-pay | Admitting: Family

## 2020-12-14 VITALS — BP 132/80 | HR 85 | Temp 98.4°F | Ht 71.0 in | Wt 184.2 lb

## 2020-12-14 DIAGNOSIS — L209 Atopic dermatitis, unspecified: Secondary | ICD-10-CM

## 2020-12-14 DIAGNOSIS — D582 Other hemoglobinopathies: Secondary | ICD-10-CM

## 2020-12-14 DIAGNOSIS — F1029 Alcohol dependence with unspecified alcohol-induced disorder: Secondary | ICD-10-CM | POA: Diagnosis not present

## 2020-12-14 DIAGNOSIS — Z720 Tobacco use: Secondary | ICD-10-CM

## 2020-12-14 DIAGNOSIS — I10 Essential (primary) hypertension: Secondary | ICD-10-CM | POA: Diagnosis not present

## 2020-12-14 MED ORDER — TRIAMCINOLONE ACETONIDE 0.1 % EX CREA: 1.0000 "application " | TOPICAL_CREAM | Freq: Every day | CUTANEOUS | 2 refills | Status: DC | PRN

## 2020-12-14 NOTE — Progress Notes (Signed)
Subjective:    Patient ID: Justin Gill, male    DOB: 04/06/1956, 65 y.o.   MRN: [id number]  Chief Complaint  Patient presents with  . Hypertension   Pt presents to the office today to recheck HTN. Pt's BP is at goal today.   He has an appointment with Cardiologists to establish care on 12/18/20.   He has an appointment with Hematologists to establish care on 12/18/20 for elevated Hgb.   He is a current smoker for 46 years and currently smoke 1/2 pack a day. He also drinks 40 oz of beer a day. He is not very active.  Hypertension This is a chronic problem. The current episode started more than 1 year ago. The problem has been resolved since onset. The problem is controlled. Pertinent negatives include no malaise/fatigue, peripheral edema, shortness of breath or sweats. Risk factors for coronary artery disease include dyslipidemia and male gender. Past treatments include calcium channel blockers. The current treatment provides moderate improvement.  Rash This is a recurrent problem. The current episode started 1 to 4 weeks ago. The problem has been waxing and waning since onset. The affected locations include the face. The rash is characterized by itchiness and dryness. He was exposed to nothing. Pertinent negatives include no shortness of breath.      Review of Systems  Constitutional: Negative for malaise/fatigue.  Respiratory: Negative for shortness of breath.   Skin: Positive for rash.  All other systems reviewed and are negative.      Objective:   Physical Exam Vitals reviewed.  Constitutional:      General: He is not in acute distress.    Appearance: He is well-developed.  HENT:     Head: Normocephalic.     Right Ear: Tympanic membrane normal.     Left Ear: Tympanic membrane normal.  Eyes:     General:        Right eye: No discharge.        Left eye: No discharge.     Pupils: Pupils are equal, round, and reactive to light.  Neck:     Thyroid: No thyromegaly.   Cardiovascular:     Rate and Rhythm: Normal rate and regular rhythm.     Heart sounds: Normal heart sounds. No murmur heard.   Pulmonary:     Effort: Pulmonary effort is normal. No respiratory distress.     Breath sounds: Normal breath sounds. No wheezing.  Abdominal:     General: Bowel sounds are normal. There is no distension.     Palpations: Abdomen is soft.     Tenderness: There is no abdominal tenderness.  Musculoskeletal:        General: No tenderness. Normal range of motion.     Cervical back: Normal range of motion and neck supple.  Skin:    General: Skin is warm and dry.     Findings: Rash present. No erythema.  Neurological:     Mental Status: He is alert and oriented to person, place, and time.     Cranial Nerves: No cranial nerve deficit.     Deep Tendon Reflexes: Reflexes are normal and symmetric.  Psychiatric:        Behavior: Behavior normal.        Thought Content: Thought content normal.        Judgment: Judgment normal.        BP 132/80   Pulse 85   Temp 98.4 F (36.9 C) (Temporal)   Ht 5\' 11"  (  1.803 m)   Wt 184 lb 3.2 oz (83.6 kg)   BMI 25.69 kg/m   Assessment & Plan:  Justin Gill comes in today with chief complaint of Hypertension   Diagnosis and orders addressed:  1. Primary hypertension  2. Tobacco abuse  3. Alcohol dependence with unspecified alcohol-induced disorder ([place])  4. Atopic dermatitis, unspecified type  5. Elevated hemoglobin ([place])    Labs reviewed  Health Maintenance reviewed Diet and exercise encouraged  Follow up plan: 6 months    Evelina Dun, FNP

## 2020-12-14 NOTE — Patient Instructions (Signed)

## 2020-12-17 NOTE — Progress Notes (Signed)
[place], [place] [address]   CLINIC:  Medical Oncology/Hematology  Patient Care Team: Sharion Balloon, FNP as PCP - General (Family Medicine) Gala Romney Cristopher Estimable, MD as Consulting Physician (Gastroenterology) Ilean China, RN as Case Manager Shea Evans Norva Riffle, LCSW as [place] [facility] (Licensed Clinical Social Worker)  CHIEF COMPLAINTS/PURPOSE OF CONSULTATION:  Evaluation of elevated hemoglobin  HISTORY OF PRESENTING ILLNESS:  Justin Gill 65 y.o. male is here because of an evaluation of elevated hemoglobin, at the request of Evelina Dun, NP.   His last colonoscopy was 11/29/2020.  His last CBC on 11/15/2020 showed hemoglobin 20 and hematocrit 57.5.  Today he reports feeling well. He denies any unexpected weight loss, fevers, night sweats. He reports an itching feeling on his back after hot showers as well as occasional facial itcing. He has eczema which causes occasional rashes on the face. He denies any orthopnea or elevated fatigue, but reports snoring loudly. He reports occasional napping after large meals. He denies blood in urine and vision changes. He has COPD which he treats with an inhaler daily. He reports trouble chewing due to issues with his teeth, but denies trouble swallowing liquids. He takes ibuprofen prn, and he is not taking any testosterone supplements. He is taking an OTC B vitamin supplement. He is due to see a cardiologist on 12/31/2020.  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 He admits to chemical exposure at work. He has been smoking off and on for 20 years since age 39; he smokes about 1/2 ppd (approx 10 cigarettes). He drinks about 40 oz of alcohol daily. He denies family history of blood disease. His father had eczema and lung cancer, and his maternal uncle had throat cancer. His maternal grandfather had lung cancer.  MEDICAL HISTORY:  Past Medical History:  Diagnosis Date  .  Arthritis   . Asthma   . Hypertension     SURGICAL HISTORY: Past Surgical History:  Procedure Laterality Date  . COLONOSCOPY WITH PROPOFOL N/A 11/29/2020   Procedure: COLONOSCOPY WITH PROPOFOL;  Surgeon: Daneil Dolin, MD;  [facility];  Service: Endoscopy;  Laterality: N/A;  AM  . ORTHOPEDIC SURGERY    . PERCUTANEOUS PINNING Bilateral 09/22/2012   Procedure: Bilateral hands/I&D and Repair as Needed;  Surgeon: Linna Hoff, MD;  Location: [facility];  Service: Orthopedics;  Laterality: Bilateral;  . POLYPECTOMY  11/29/2020   Procedure: POLYPECTOMY;  Surgeon: Daneil Dolin, MD;  [facility];  Service: Endoscopy;;  . TONSILLECTOMY      SOCIAL HISTORY: Social History   Socioeconomic History  . Marital status: Divorced    Spouse name: Not on file  . Number of children: Not on file  . Years of education: Not on file  . Highest education level: Not on file  Occupational History  . Not on file  Tobacco Use  . Smoking status: Current Every Day Smoker    Packs/day: 0.50    Years: 20.00    Pack years: 10.00    Types: Cigarettes  . Smokeless tobacco: Never Used  Vaping Use  . Vaping Use: Never used  Substance and Sexual Activity  . Alcohol use: Yes    Comment: 40 ounces per day  . Drug use: No  . Sexual activity: Not on file  Other Topics Concern  . Not on file  Social History Narrative  . Not on file   Social  Determinants of Health   Financial Resource Strain: Medium Risk  . Difficulty of Paying Living Expenses: Somewhat hard  Food Insecurity: No Food Insecurity  . Worried About Charity fundraiser in the Last Year: Never true  . Ran Out of Food in the Last Year: Never true  Transportation Needs: No Transportation Needs  . Lack of Transportation (Medical): No  . Lack of Transportation (Non-Medical): No  Physical Activity: Insufficiently Active  . Days of Exercise per Week: 2 days  . Minutes of Exercise per Session: 20 min  Stress: No Stress  Concern Present  . Feeling of Stress : Not at all  Social Connections: Moderately Isolated  . Frequency of Communication with Friends and Family: More than three times a week  . Frequency of Social Gatherings with Friends and Family: More than three times a week  . Attends Religious Services: More than 4 times per year  . Active Member of Clubs or Organizations: No  . Attends Archivist Meetings: Never  . Marital Status: Divorced  Human resources officer Violence: Not At Risk  . Fear of Current or Ex-Partner: No  . Emotionally Abused: No  . Physically Abused: No  . Sexually Abused: No    FAMILY HISTORY: Family History  Problem Relation Age of Onset  . Diabetes Mother   . Heart disease Mother   . Hyperlipidemia Mother   . Hypertension Mother   . Colon cancer Neg Hx   . Colon polyps Neg Hx     ALLERGIES:  is allergic to bee venom.  MEDICATIONS:  Current Outpatient Medications  Medication Sig Dispense Refill  . amLODipine (NORVASC) 10 MG tablet Take 1 tablet (10 mg total) by mouth daily. 90 tablet 1  . budesonide-formoterol (SYMBICORT) 160-4.5 MCG/ACT inhaler Inhale 2 puffs into the lungs 2 (two) times daily. 1 each 3  . Cyanocobalamin (B-12 PO) Take 1 tablet by mouth daily.    . Multiple Vitamin (MULTIVITAMIN WITH MINERALS) TABS Take 1 tablet by mouth daily.    Marland Kitchen pyridOXINE (VITAMIN B-6) 100 MG tablet Take 100 mg by mouth daily.    Marland Kitchen triamcinolone cream (KENALOG) 0.1 % Apply 1 application topically daily as needed (Rash). 80 g 2  . albuterol (PROVENTIL HFA;VENTOLIN HFA) 108 (90 Base) MCG/ACT inhaler Inhale 2 puffs into the lungs every 6 (six) hours as needed for wheezing. (Patient not taking: Reported on 12/18/2020) 1 Inhaler 2  . diphenhydrAMINE (BENADRYL) 25 MG tablet Take 25 mg by mouth every 6 (six) hours as needed. (Patient not taking: Reported on 12/18/2020)    . ibuprofen (ADVIL) 800 MG tablet Take 1 tablet (800 mg total) by mouth every 8 (eight) hours as needed.  (Patient not taking: Reported on 12/18/2020) 60 tablet 5  . sildenafil (VIAGRA) 100 MG tablet Take 1 tablet (100 mg total) by mouth as needed for erectile dysfunction. (Patient not taking: Reported on 12/18/2020) 30 tablet 1   No current facility-administered medications for this visit.    REVIEW OF SYSTEMS:   Review of Systems  Constitutional: Positive for appetite change (75%) and fatigue (75%).  HENT:   Positive for trouble swallowing.   Eyes: Negative for eye problems.  Respiratory: Positive for cough (COPD) and shortness of breath (COPD).   Genitourinary: Negative for hematuria.   Psychiatric/Behavioral: Positive for sleep disturbance.  All other systems reviewed and are negative.    PHYSICAL EXAMINATION: ECOG PERFORMANCE STATUS: 1 - Symptomatic but completely ambulatory  Vitals:   12/18/20 0808  BP: 123/88  Pulse: 89  Resp: 18  Temp: 97.9 F (36.6 C)  SpO2: 93%   Filed Weights   12/18/20 0808  Weight: 185 lb 9.6 oz (84.2 kg)   Physical Exam Vitals reviewed.  Constitutional:      Appearance: Normal appearance.  Cardiovascular:     Rate and Rhythm: Normal rate and regular rhythm.     Pulses: Normal pulses.     Heart sounds: Normal heart sounds.  Pulmonary:     Effort: Pulmonary effort is normal.     Breath sounds: Normal breath sounds.  Chest:  Breasts:     Right: No axillary adenopathy.     Left: No axillary adenopathy.    Abdominal:     Palpations: Abdomen is soft. There is no hepatomegaly, splenomegaly or mass.     Tenderness: There is no abdominal tenderness.  Musculoskeletal:     Right lower leg: No edema.     Left lower leg: No edema.  Lymphadenopathy:     Cervical: No cervical adenopathy.     Right cervical: No superficial cervical adenopathy.    Left cervical: No superficial cervical adenopathy.     Upper Body:     Right upper body: No axillary or pectoral adenopathy.     Left upper body: No axillary or pectoral adenopathy.     Lower Body: No  right inguinal adenopathy. No left inguinal adenopathy.  Neurological:     General: No focal deficit present.     Mental Status: He is alert and oriented to person, place, and time.  Psychiatric:        Mood and Affect: Mood normal.        Behavior: Behavior normal.      LABORATORY DATA:  I have reviewed the data as listed Recent Results (from the past 2160 hour(s))  Comprehensive Metabolic Panel (CMET)     Status: Abnormal   Collection Time: 11/15/20 12:54 PM  Result Value Ref Range   Glucose 95 65 - 99 mg/dL   BUN 7 (L) 8 - 27 mg/dL   Creatinine, Ser 0.91 0.76 - 1.27 mg/dL   eGFR 93 >59 mL/min/1.73   BUN/Creatinine Ratio 8 (L) 10 - 24   Sodium 140 134 - 144 mmol/L   Potassium 4.6 3.5 - 5.2 mmol/L   Chloride 100 96 - 106 mmol/L   CO2 22 20 - 29 mmol/L   Calcium 9.5 8.6 - 10.2 mg/dL   Total Protein 7.5 6.0 - 8.5 g/dL   Albumin 4.4 3.8 - 4.8 g/dL   Globulin, Total 3.1 1.5 - 4.5 g/dL   Albumin/Globulin Ratio 1.4 1.2 - 2.2   Bilirubin Total 1.8 (H) 0.0 - 1.2 mg/dL   Alkaline Phosphatase 73 44 - 121 IU/L   AST 18 0 - 40 IU/L   ALT 23 0 - 44 IU/L  CBC with Differential     Status: Abnormal   Collection Time: 11/15/20 12:55 PM  Result Value Ref Range   WBC 4.0 3.4 - 10.8 x10E3/uL   RBC 6.26 (H) 4.14 - 5.80 x10E6/uL   Hemoglobin 20.0 (HH) 13.0 - 17.7 g/dL    Comment: Removal of plasma from EDTA tube will result in spuriously elevated CBC results. Redraw and repeat if results do not correlate with patient's clinical condition.    Hematocrit 57.5 (H) 37.5 - 51.0 %   MCV 92 79 - 97 fL   MCH 31.9 26.6 - 33.0 pg   MCHC 34.8 31.5 - 35.7 g/dL   RDW 13.9  11.6 - 15.4 %   Platelets 252 150 - 450 x10E3/uL   Neutrophils 47 Not Estab. %   Lymphs 33 Not Estab. %   Monocytes 11 Not Estab. %   Eos 6 Not Estab. %   Basos 2 Not Estab. %   Neutrophils Absolute 1.9 1.4 - 7.0 x10E3/uL   Lymphocytes Absolute 1.3 0.7 - 3.1 x10E3/uL   Monocytes Absolute 0.5 0.1 - 0.9 x10E3/uL   EOS  (ABSOLUTE) 0.3 0.0 - 0.4 x10E3/uL   Basophils Absolute 0.1 0.0 - 0.2 x10E3/uL   Immature Granulocytes 1 Not Estab. %   Immature Grans (Abs) 0.0 0.0 - 0.1 x10E3/uL  SARS CORONAVIRUS 2 (TAT 6-24 HRS) Nasopharyngeal Nasopharyngeal Swab     Status: None   Collection Time: 11/27/20  2:51 PM   Specimen: Nasopharyngeal Swab  Result Value Ref Range   SARS Coronavirus 2 NEGATIVE NEGATIVE    Comment: (NOTE) SARS-CoV-2 target nucleic acids are NOT DETECTED.  The SARS-CoV-2 RNA is generally detectable in upper and lower respiratory specimens during the acute phase of infection. Negative results do not preclude SARS-CoV-2 infection, do not rule out co-infections with other pathogens, and should not be used as the sole basis for treatment or other patient management decisions. Negative results must be combined with clinical observations, patient history, and epidemiological information. The expected result is Negative.  Fact Sheet for Patients: SugarRoll.be  Fact Sheet for Healthcare Providers: https://www.woods-mathews.com/  This test is not yet approved or cleared by the [place] FDA and  has been authorized for detection and/or diagnosis of SARS-CoV-2 by FDA under an Emergency Use Authorization (EUA). This EUA will remain  in effect (meaning this test can be used) for the duration of the COVID-19 declaration under Se ction 564(b)(1) of the Act, 21 U.S.C. section 360bbb-3(b)(1), unless the authorization is terminated or revoked sooner.  Performed at [facility], [place] [address], [place], [place] [address]   Surgical pathology     Status: None   Collection Time: 11/29/20  8:09 AM  Result Value Ref Range   SURGICAL PATHOLOGY      SURGICAL PATHOLOGY CASE: [id number] PATIENT: Justin Gill Surgical Pathology Report     Clinical History: screening colonoscopy     FINAL MICROSCOPIC DIAGNOSIS:  A. COLON, HEPATIC FLEXURE,  POLYPECTOMY: - Tubular adenoma (x2 fragments). - No high grade dysplasia or malignancy.  B. COLON, CECAL, POLYPECTOMY: - Tubular adenoma (x4 fragments). - No high grade dysplasia or malignancy.  C. COLON, DESCENDING, POLYPECTOMY: - Tubular adenoma (x2 fragments). - No high grade dysplasia or malignancy.  D. RECTAL, POLYPECTOMY: - Tubular adenoma. - No high grade dysplasia or malignancy.   GROSS DESCRIPTION:  A: Received in formalin are tan, soft tissue fragments that are submitted in toto. Number: 6 size: 0.5-1.3 cm blocks: 1  B: Received in formalin are tan, soft tissue fragments that are submitted in toto. Number: 6 size: 0.2-0.8 cm blocks: 1  C: Received in formalin are 2 polypoid portions of tan mucosa measuring 0.5 and 1.5 cm in greatest dimension.  The  larger piece is inked and sectioned.  The specimen is entirely submitted in 1 cassette.  D: Received in formalin is a 0.6 cm tan mucosal polyp.  Specimen is inked, bisected and entirely submitted in 1 cassette.  [facility] 11/29/2020)   Final Diagnosis performed by Vicente Males, MD.   Electronically signed 11/30/2020 Technical component performed at [facility], [place] [address], [place], [place] [address].  Professional component performed  at [facility], [place] [address], [place], [place] [address].  Immunohistochemistry Technical component (if applicable) was performed at [facility]. [address], [place], [place], [place] [address].   IMMUNOHISTOCHEMISTRY DISCLAIMER (if applicable): Some of these immunohistochemical stains may have been developed and the performance characteristics determine by [facility]. Some may not have been cleared or approved by the U.S. Food and Drug Administration. The FDA has  determined that such clearance or approval is not necessary. This test is used for clinical purposes. It should not be regarded as investigational or  for research. This laboratory is certified under the [place] (CLIA-88) as qualified to perform high complexity clinical laboratory testing.  The controls stained appropriately.   CBC with Differential/Platelet     Status: Abnormal   Collection Time: 12/18/20  9:17 AM  Result Value Ref Range   WBC 4.4 4.0 - 10.5 K/uL   RBC 5.61 4.22 - 5.81 MIL/uL   Hemoglobin 18.7 (H) 13.0 - 17.0 g/dL   HCT 54.2 (H) 39.0 - 52.0 %   MCV 96.6 80.0 - 100.0 fL   MCH 33.3 26.0 - 34.0 pg   MCHC 34.5 30.0 - 36.0 g/dL   RDW 13.2 11.5 - 15.5 %   Platelets 247 150 - 400 K/uL   nRBC 0.0 0.0 - 0.2 %   Neutrophils Relative % 39 %   Neutro Abs 1.7 1.7 - 7.7 K/uL   Lymphocytes Relative 41 %   Lymphs Abs 1.8 0.7 - 4.0 K/uL   Monocytes Relative 12 %   Monocytes Absolute 0.5 0.1 - 1.0 K/uL   Eosinophils Relative 5 %   Eosinophils Absolute 0.2 0.0 - 0.5 K/uL   Basophils Relative 2 %   Basophils Absolute 0.1 0.0 - 0.1 K/uL   Immature Granulocytes 1 %   Abs Immature Granulocytes 0.04 0.00 - 0.07 K/uL    Comment: Performed at [facility], [address] [place], [place] [address]  Lactate dehydrogenase     Status: None   Collection Time: 12/18/20  9:17 AM  Result Value Ref Range   LDH 132 98 - 192 U/L    Comment: Performed at [facility], [address], [place], [place] [address]  Urinalysis, Routine w reflex microscopic     Status: None   Collection Time: 12/18/20  9:23 AM  Result Value Ref Range   Color, Urine YELLOW YELLOW   APPearance CLEAR CLEAR   Specific Gravity, Urine 1.017 1.005 - 1.030   pH 5.0 5.0 - 8.0   Glucose, UA NEGATIVE NEGATIVE mg/dL   Hgb urine dipstick NEGATIVE NEGATIVE   Bilirubin Urine NEGATIVE NEGATIVE   Ketones, ur NEGATIVE NEGATIVE mg/dL   Protein, ur NEGATIVE NEGATIVE mg/dL   Nitrite NEGATIVE NEGATIVE   Leukocytes,Ua NEGATIVE NEGATIVE    Comment: Performed at [facility], [address], [place], [place] [address]    RADIOGRAPHIC  STUDIES: I have personally reviewed the radiological images as listed and agreed with the findings in the report. No results found.  ASSESSMENT:  1.  Erythrocytosis: -Seen at the request of Evelina Dun, FNP for evaluation of erythrocytosis. - Labs on 11/15/2020 shows hemoglobin 20/57.5.  Hemoglobin on 10/31/2019 was 20.3/58.5.  Prior to that hemoglobin was normal. - Denies any significant aquagenic pruritus.  He has occasional itching on the back after hot shower.  Denies any vasomotor symptoms.  He is not on any diuretics. - He does snore but denies any excessive  daytime sleepiness.  He sleeps well at night. - No headaches or vision changes.  No hematuria.  He is not on testosterone supplements.  2.  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  Exposure to fumes present. -Current smoker, half pack per day for 30 years.  He drinks 40 ounce of beer daily. - Father died of lung cancer.  Maternal uncle had throat cancer.  Maternal grandfather had lung cancer.  PLAN:  1.  Erythrocytosis: - We have discussed benign and malignant causes of erythrocytosis.  His white count and platelet count were normal. - I have recommended repeating CBC with differential.  We will also check serum erythropoietin level. - Will check JAK2 V617F and reflex testing.  We will check UA for hematuria.  If positive will obtain abdominal imaging for renal and hepatic cysts. - RTC 4 weeks to discuss results and further plan.  All questions were answered. The patient knows to call the clinic with any problems, questions or concerns.   Derek Jack, MD 12/18/20 5:50 PM  [place] [phone]   I, Thana Ates, am acting as a scribe for Dr. Derek Jack.  I, Derek Jack MD, have reviewed the above documentation for accuracy and completeness, and I agree with the above.

## 2020-12-18 ENCOUNTER — Inpatient Hospital Stay (HOSPITAL_COMMUNITY): Payer: Medicare Other | Attending: Hematology | Admitting: Hematology

## 2020-12-18 ENCOUNTER — Other Ambulatory Visit: Payer: Self-pay

## 2020-12-18 ENCOUNTER — Inpatient Hospital Stay (HOSPITAL_COMMUNITY): Payer: Medicare Other

## 2020-12-18 VITALS — BP 123/88 | HR 89 | Temp 97.9°F | Resp 18 | Ht 71.0 in | Wt 185.6 lb

## 2020-12-18 DIAGNOSIS — J449 Chronic obstructive pulmonary disease, unspecified: Secondary | ICD-10-CM | POA: Diagnosis not present

## 2020-12-18 DIAGNOSIS — L299 Pruritus, unspecified: Secondary | ICD-10-CM | POA: Insufficient documentation

## 2020-12-18 DIAGNOSIS — F17210 Nicotine dependence, cigarettes, uncomplicated: Secondary | ICD-10-CM | POA: Diagnosis not present

## 2020-12-18 DIAGNOSIS — D582 Other hemoglobinopathies: Secondary | ICD-10-CM

## 2020-12-18 DIAGNOSIS — Z79899 Other long term (current) drug therapy: Secondary | ICD-10-CM | POA: Insufficient documentation

## 2020-12-18 DIAGNOSIS — D751 Secondary polycythemia: Secondary | ICD-10-CM | POA: Insufficient documentation

## 2020-12-18 LAB — CBC WITH DIFFERENTIAL/PLATELET
Abs Immature Granulocytes: 0.04 10*3/uL (ref 0.00–0.07)
Basophils Absolute: 0.1 10*3/uL (ref 0.0–0.1)
Basophils Relative: 2 %
Eosinophils Absolute: 0.2 10*3/uL (ref 0.0–0.5)
Eosinophils Relative: 5 %
HCT: 54.2 % — ABNORMAL HIGH (ref 39.0–52.0)
Hemoglobin: 18.7 g/dL — ABNORMAL HIGH (ref 13.0–17.0)
Immature Granulocytes: 1 %
Lymphocytes Relative: 41 %
Lymphs Abs: 1.8 10*3/uL (ref 0.7–4.0)
MCH: 33.3 pg (ref 26.0–34.0)
MCHC: 34.5 g/dL (ref 30.0–36.0)
MCV: 96.6 fL (ref 80.0–100.0)
Monocytes Absolute: 0.5 10*3/uL (ref 0.1–1.0)
Monocytes Relative: 12 %
Neutro Abs: 1.7 10*3/uL (ref 1.7–7.7)
Neutrophils Relative %: 39 %
Platelets: 247 10*3/uL (ref 150–400)
RBC: 5.61 MIL/uL (ref 4.22–5.81)
RDW: 13.2 % (ref 11.5–15.5)
WBC: 4.4 10*3/uL (ref 4.0–10.5)
nRBC: 0 % (ref 0.0–0.2)

## 2020-12-18 LAB — URINALYSIS, ROUTINE W REFLEX MICROSCOPIC
Bilirubin Urine: NEGATIVE
Glucose, UA: NEGATIVE mg/dL
Hgb urine dipstick: NEGATIVE
Ketones, ur: NEGATIVE mg/dL
Leukocytes,Ua: NEGATIVE
Nitrite: NEGATIVE
Protein, ur: NEGATIVE mg/dL
Specific Gravity, Urine: 1.017 (ref 1.005–1.030)
pH: 5 (ref 5.0–8.0)

## 2020-12-18 LAB — LACTATE DEHYDROGENASE: LDH: 132 U/L (ref 98–192)

## 2020-12-18 NOTE — Patient Instructions (Signed)
[facility] Discharge Instructions  You were seen today by Dr. Katragadda. He went over your recent results. Dr. Katragadda will see you back in 4 weeks for labs and follow up.   Thank you for choosing  [facility] at [facility] to provide your oncology and hematology care.  To afford each patient quality time with our provider, please arrive at least 15 minutes before your scheduled appointment time.   If you have a lab appointment with the Cancer Center please come in thru the Main Entrance and check in at the main information desk  You need to re-schedule your appointment should you arrive 10 or more minutes late.  We strive to give you quality time with our providers, and arriving late affects you and other patients whose appointments are after yours.  Also, if you no show three or more times for appointments you may be dismissed from the clinic at the providers discretion.     Again, thank you for choosing [facility].  Our hope is that these requests will decrease the amount of time that you wait before being seen by our physicians.       _____________________________________________________________  Should you have questions after your visit to [facility], please contact our office at [phone] between the hours of 8:00 a.m. and 4:30 p.m.  Voicemails left after 4:00 p.m. will not be returned until the following business day.  For prescription refill requests, have your pharmacy contact our office and allow 72 hours.    Cancer Center Support Programs:   > Cancer Support Group  2nd Tuesday of the month 1pm-2pm, Journey Room    

## 2020-12-18 NOTE — Progress Notes (Signed)
Patient was assessed by Dr. Delton Coombes and labs have been reviewed.  Patient to have labs done today and return to office in 4 weeks to plan.  No distress noted.

## 2020-12-19 LAB — ERYTHROPOIETIN: Erythropoietin: 8 m[IU]/mL (ref 2.6–18.5)

## 2021-01-04 ENCOUNTER — Ambulatory Visit: Payer: Medicare Other | Admitting: Family

## 2021-01-04 LAB — CALR + JAK2 E12-15 + MPL (REFLEXED)

## 2021-01-04 LAB — JAK2 V617F, W REFLEX TO CALR/E12/MPL

## 2021-01-16 ENCOUNTER — Inpatient Hospital Stay (HOSPITAL_COMMUNITY): Payer: Medicare Other | Admitting: Hematology and Oncology

## 2021-01-17 ENCOUNTER — Ambulatory Visit (HOSPITAL_COMMUNITY): Payer: Medicare Other | Admitting: Hematology

## 2021-01-17 NOTE — Progress Notes (Signed)
CARDIOLOGY CONSULT NOTE       Patient ID: Justin Gill MRN: [id number] DOB/AGE: 1956/06/05 65 y.o.  Admit date: (Not on file) Referring Physician: Lenna Gilford Primary Physician: Sharion Balloon, FNP Primary Cardiologist: New Reason for Consultation: Abnormal ECG  Active Problems:   * No active hospital problems. *   HPI:  65 y.o. referred by Dr Lenna Gilford for abnormal ECG. 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 [place] All other systems reviewed and negative except as noted above  Past Medical History:  Diagnosis Date   Arthritis    Asthma    Hypertension     Family History  Problem Relation Age of Onset   Diabetes Mother    Heart disease Mother    Hyperlipidemia Mother    Hypertension Mother    Colon cancer Neg Hx    Colon polyps Neg Hx     Social History   Socioeconomic History   Marital status: Divorced    Spouse name: Not on file   Number of children: Not on file   Years of education: Not on file   Highest education level: Not on file  Occupational History   Not on file  Tobacco Use   Smoking status: Every Day    Packs/day: 0.50    Years: 20.00    Pack years: 10.00    Types: Cigarettes   Smokeless tobacco: Never  Vaping Use   Vaping Use: Never used  Substance and Sexual Activity   Alcohol use: Yes    Comment: 40 ounces per day   Drug use: No   Sexual activity: Not on file  Other Topics Concern   Not on file  Social History Narrative   Not on file   Social Determinants of Health   Financial Resource Strain:  Medium Risk   Difficulty of Paying Living Expenses: Somewhat hard  Food Insecurity: No Food Insecurity   Worried About Charity fundraiser in the Last Year: Never true   Ran Out of Food in the Last Year: Never true  Transportation Needs: No Transportation Needs   Lack of Transportation (Medical): No   Lack of Transportation (Non-Medical): No  Physical Activity: Insufficiently Active   Days of Exercise per Week: 2 days   Minutes of Exercise per Session: 20 min  Stress: No Stress Concern Present   Feeling of Stress : Not at all  Social Connections: Moderately Isolated   Frequency of Communication with Friends and Family: More than three times a week   Frequency of Social Gatherings with Friends and Family: More than three times a week   Attends Religious Services: More than 4 times per year   Active Member of [employer] or Organizations: No   Attends Archivist Meetings: Never   Marital Status: Divorced  Human resources officer Violence: Not At Risk   Fear of Current or Ex-Partner: No   Emotionally Abused: No   Physically Abused: No   Sexually  Abused: No    Past Surgical History:  Procedure Laterality Date   COLONOSCOPY WITH PROPOFOL N/A 11/29/2020   Procedure: COLONOSCOPY WITH PROPOFOL;  Surgeon: Daneil Dolin, MD;  [facility];  Service: Endoscopy;  Laterality: N/A;  AM   ORTHOPEDIC SURGERY     PERCUTANEOUS PINNING Bilateral 09/22/2012   Procedure: Bilateral hands/I&D and Repair as Needed;  Surgeon: Linna Hoff, MD;  Location: [place];  Service: Orthopedics;  Laterality: Bilateral;   POLYPECTOMY  11/29/2020   Procedure: POLYPECTOMY;  Surgeon: Daneil Dolin, MD;  [facility];  Service: Endoscopy;;   TONSILLECTOMY        Current Outpatient Medications:    albuterol (PROVENTIL HFA;VENTOLIN HFA) 108 (90 Base) MCG/ACT inhaler, Inhale 2 puffs into the lungs every 6 (six) hours as needed for wheezing., Disp: 1 Inhaler, Rfl: 2   amLODipine (NORVASC) 10 MG  tablet, Take 1 tablet (10 mg total) by mouth daily., Disp: 90 tablet, Rfl: 1   budesonide-formoterol (SYMBICORT) 160-4.5 MCG/ACT inhaler, Inhale 2 puffs into the lungs 2 (two) times daily., Disp: 1 each, Rfl: 3   Cyanocobalamin (B-12 PO), Take 1 tablet by mouth daily., Disp: , Rfl:    diphenhydrAMINE (BENADRYL) 25 MG tablet, Take 25 mg by mouth every 6 (six) hours as needed., Disp: , Rfl:    ibuprofen (ADVIL) 800 MG tablet, Take 1 tablet (800 mg total) by mouth every 8 (eight) hours as needed., Disp: 60 tablet, Rfl: 5   Multiple Vitamin (MULTIVITAMIN WITH MINERALS) TABS, Take 1 tablet by mouth daily., Disp: , Rfl:    pyridOXINE (VITAMIN B-6) 100 MG tablet, Take 100 mg by mouth daily., Disp: , Rfl:    sildenafil (VIAGRA) 100 MG tablet, Take 1 tablet (100 mg total) by mouth as needed for erectile dysfunction., Disp: 30 tablet, Rfl: 1   triamcinolone cream (KENALOG) 0.1 %, Apply 1 application topically daily as needed (Rash)., Disp: 80 g, Rfl: 2    Physical Exam: Blood pressure 118/66, pulse 80, height 5\' 11"  (1.803 m), weight 83.9 kg, SpO2 95 %.    Affect appropriate Healthy:  appears stated age 35: normal Neck supple with no adenopathy JVP normal no bruits no thyromegaly Lungs clear with no wheezing and good diaphragmatic motion Heart:  S1/S2 no murmur, no rub, gallop or click PMI normal Abdomen: benighn, BS positve, no tenderness, no AAA no bruit.  No HSM or HJR Distal pulses intact with no bruits No edema Neuro non-focal Skin warm and dry No muscular weakness   Labs:   Lab Results  Component Value Date   WBC 4.4 12/18/2020   HGB 18.7 (H) 12/18/2020   HCT 54.2 (H) 12/18/2020   MCV 96.6 12/18/2020   PLT 247 12/18/2020   No results for input(s): NA, K, CL, CO2, BUN, CREATININE, CALCIUM, PROT, BILITOT, ALKPHOS, ALT, AST, GLUCOSE in the last 168 hours.  Invalid input(s): LABALBU No results found for: CKTOTAL, CKMB, CKMBINDEX, TROPONINI  Lab Results  Component Value Date    CHOL 137 10/31/2019   CHOL 167 04/29/2017   CHOL 187 05/01/2016   Lab Results  Component Value Date   HDL 53 10/31/2019   HDL 61 04/29/2017   HDL 69 05/01/2016   Lab Results  Component Value Date   LDLCALC 63 10/31/2019   LDLCALC 56 04/29/2017   LDLCALC 86 05/01/2016   Lab Results  Component Value Date   TRIG 118 10/31/2019   TRIG 252 (H) 04/29/2017   TRIG 161 (H)  05/01/2016   Lab Results  Component Value Date   CHOLHDL 2.6 10/31/2019   CHOLHDL 2.7 04/29/2017   CHOLHDL 2.7 05/01/2016   No results found for: LDLDIRECT    Radiology: No results found.  EKG: See HPI   ASSESSMENT AND PLAN:   Abnormal ECG:  likely related to HTN heart disease will order echo to assess for degree of LVH and EF. Given risk factors QT, PVC will order lexiscan myovue to r/o CAD ETOH Abuse:  counseled on cessation has had colonoscopy but not EGD to look for varices. LFTls ok  Polycythemia:  f/u hematology likely from smoking erythropoietin level normal JAK2 mutation negative  Smoking: counseled on cessation < 10 minutes Has inhaler will order lung cancer screening CT No recent CXR HTN:  controlled on current meds   Echo Lung cancer CT  F/U in a year   Signed: Jenkins Rouge 01/30/2021, 10:15 AM

## 2021-01-22 ENCOUNTER — Telehealth: Payer: Medicare Other

## 2021-01-23 ENCOUNTER — Inpatient Hospital Stay (HOSPITAL_COMMUNITY): Payer: Medicare Other | Attending: Hematology | Admitting: Physician Assistant

## 2021-01-23 ENCOUNTER — Encounter (HOSPITAL_COMMUNITY): Payer: Self-pay | Admitting: Physician Assistant

## 2021-01-23 ENCOUNTER — Other Ambulatory Visit: Payer: Self-pay

## 2021-01-23 DIAGNOSIS — D582 Other hemoglobinopathies: Secondary | ICD-10-CM | POA: Diagnosis not present

## 2021-01-23 NOTE — Progress Notes (Signed)
Virtual Visit via Telephone Note [facility]  I connected with Justin Gill on 01/23/21  at 2:08 PM  by telephone and verified that I am speaking with the correct person using two identifiers.  Location: Patient: Home Provider: [facility]    I discussed the limitations, risks, security and privacy concerns of performing an evaluation and management service by telephone and the availability of in person appointments. I also discussed with the patient that there may be a patient responsible charge related to this service. The patient expressed understanding and agreed to proceed.   History of Present Illness: Mr. Justin Gill is contacted today for follow-up of his erythrocytosis.  He was seen for initial consultation by Dr. Delton Coombes on 12/18/2020.  His visit today is to discuss results and next steps.  At his last visit, he was noted to have elevated hemoglobin and hematocrit, referred to [place] by PCP.    He previously reported some mild aquagenic pruritus after hot shower, but reports that this has resolved.  No vasomotor symptoms.  He is not on any diuretics.  No headaches, vision changes, or hematuria.    He admits to being a current 0.5 PPD smoker, also drinks about 40 ounces of alcohol daily.  Patient reported that he does snore but denies excessive daytime sleepiness, reports that he sleeps well at night.   He is not on testosterone supplements.  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  At today's phone visit, he reports overall feeling well.  No hospital stays or changes in baseline health status since his last visit.    Observations/Objective: Review of Systems  Constitutional:  Negative for chills, diaphoresis, fever, malaise/fatigue and weight loss.  Respiratory:  Negative for cough and shortness of breath.   Cardiovascular:  Negative for chest pain and palpitations.  Gastrointestinal:  Negative for  abdominal pain, blood in stool, melena, nausea and vomiting.  Neurological:  Negative for dizziness and headaches.    PHYSICAL EXAM (per limitations of virtual telephone visit): The patient is alert and oriented x 3, exhibiting adequate mentation, good mood, and ability to speak in full sentences and execute sound judgement.   ASSESSMENT & PLAN: 1.  Secondary polycythemia - Seen at the request of Evelina Dun, FNP for evaluation of erythrocytosis. - Labs on 11/15/2020 shows hemoglobin 20/57.5.  Hemoglobin on 10/31/2019 was 20.3/58.5.  Prior to that hemoglobin was normal. - Denies any significant aquagenic pruritus.  He has occasional itching on the back after hot shower.  Denies any vasomotor symptoms.  He is not on any diuretics.  No history of blood clots. - He does snore but denies any excessive daytime sleepiness.  He sleeps well at night. - No headaches or vision changes.  No hematuria.  He is not on testosterone supplements. -Work-up (12/18/2020) revealed Hgb 18.7/HCT 54.2 (normal WBC, platelets, differential), normal erythropoietin 8.0, normal LDH 132; testing NEGATIVE for mutations in JAK2 V617F, CALR, E12, MPL; UA negative for hematuria - Differential diagnosis favors reactive erythrocytosis/secondary polycythemia secondary to smoking - PLAN:  Suspect secondary polycythemia related to smoking, will check carboxyhemoglobin level with next lab draw to confirm.  Discussed with patient that primary treatment of secondary polycythemia is correction of the precipitating cause, which in this case would be to stop smoking.  Recommend low-dose aspirin to decrease risk of thromboembolic event.  We will consider phlebotomy for significantly increased Hgb/HCT (> 60%) or for  high symptom burden.  RTC in 4 months with repeat labs.  2.  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  Exposure to fumes present. - Current smoker, half pack per day for 30 years.  He drinks 40 ounce  of beer daily. - Father died of lung cancer.  Maternal uncle had throat cancer.  Maternal grandfather had lung cancer.   Follow Up Instructions: - Start taking aspirin 81 mg daily - Strongly recommend smoking cessation - Labs in 4 months - Office visit after labs    I discussed the assessment and treatment plan with the patient. The patient was provided an opportunity to ask questions and all were answered. The patient agreed with the plan and demonstrated an understanding of the instructions.   The patient was advised to call back or seek an in-person evaluation if the symptoms worsen or if the condition fails to improve as anticipated.  I provided 9 minutes of non-face-to-face time during this encounter.   Harriett Rush, PA-C 01/23/21 2:24 PM

## 2021-01-30 ENCOUNTER — Encounter: Payer: Self-pay | Admitting: Cardiovascular Disease

## 2021-01-30 ENCOUNTER — Other Ambulatory Visit: Payer: Self-pay

## 2021-01-30 ENCOUNTER — Ambulatory Visit (INDEPENDENT_AMBULATORY_CARE_PROVIDER_SITE_OTHER): Payer: Medicare Other | Admitting: Cardiovascular Disease

## 2021-01-30 VITALS — BP 118/66 | HR 80 | Ht 71.0 in | Wt 185.0 lb

## 2021-01-30 DIAGNOSIS — F17200 Nicotine dependence, unspecified, uncomplicated: Secondary | ICD-10-CM

## 2021-01-30 DIAGNOSIS — I10 Essential (primary) hypertension: Secondary | ICD-10-CM

## 2021-01-30 DIAGNOSIS — D751 Secondary polycythemia: Secondary | ICD-10-CM | POA: Diagnosis not present

## 2021-01-30 DIAGNOSIS — Z01818 Encounter for other preprocedural examination: Secondary | ICD-10-CM

## 2021-01-30 DIAGNOSIS — F1010 Alcohol abuse, uncomplicated: Secondary | ICD-10-CM

## 2021-01-30 DIAGNOSIS — R9431 Abnormal electrocardiogram [ECG] [EKG]: Secondary | ICD-10-CM | POA: Diagnosis not present

## 2021-01-30 NOTE — Addendum Note (Signed)
Addended by: Barbarann Ehlers A on: 01/30/2021 10:31 AM   Modules accepted: Orders

## 2021-01-30 NOTE — Patient Instructions (Signed)
Medication Instructions:  Your physician recommends that you continue on your current medications as directed. Please refer to the Current Medication list given to you today.  *If you need a refill on your cardiac medications before your next appointment, please call your pharmacy*   Lab Work: BMET 1 week before chest CT   If you have labs (blood work) drawn today and your tests are completely normal, you will receive your results only by: [place] (if you have MyChart) OR A paper copy in the mail If you have any lab test that is abnormal or we need to change your treatment, we will call you to review the results.   Testing/Procedures: Chest CT screen for lung cancer  Your physician has requested that you have an echocardiogram. Echocardiography is a painless test that uses sound waves to create images of your heart. It provides your doctor with information about the size and shape of your heart and how well your heart's chambers and valves are working. This procedure takes approximately one hour. There are no restrictions for this procedure.    Follow-Up: At [facility], you and your health needs are our priority.  As part of our continuing mission to provide you with exceptional heart care, we have created designated Provider Care Teams.  These Care Teams include your primary Cardiologist (physician) and Advanced Practice Providers (APPs -  Physician Assistants and Nurse Practitioners) who all work together to provide you with the care you need, when you need it.  We recommend signing up for the patient portal called "MyChart".  Sign up information is provided on this After Visit Summary.  MyChart is used to connect with patients for Virtual Visits (Telemedicine).  Patients are able to view lab/test results, encounter notes, upcoming appointments, etc.  Non-urgent messages can be sent to your provider as well.   To learn more about what you can do with MyChart, go to  [email or web address].    Your next appointment:   12 month(s)  The format for your next appointment:   In Person  Provider:   Jenkins Rouge, MD   Other Instructions None

## 2021-02-06 ENCOUNTER — Telehealth: Payer: Self-pay | Admitting: Gastroenterology

## 2021-02-06 NOTE — Telephone Encounter (Signed)
Opened in error

## 2021-02-11 ENCOUNTER — Ambulatory Visit (HOSPITAL_COMMUNITY)
Admission: RE | Admit: 2021-02-11 | Discharge: 2021-02-11 | Disposition: A | Discharge status: Home / Self Care | Payer: Medicare Other | Source: Ambulatory Visit | Attending: Cardiovascular Disease | Admitting: Cardiovascular Disease

## 2021-02-11 ENCOUNTER — Telehealth: Payer: Self-pay

## 2021-02-11 ENCOUNTER — Other Ambulatory Visit: Payer: Self-pay

## 2021-02-11 DIAGNOSIS — R9431 Abnormal electrocardiogram [ECG] [EKG]: Secondary | ICD-10-CM | POA: Insufficient documentation

## 2021-02-11 LAB — ECHOCARDIOGRAM COMPLETE
Area-P 1/2: 1.78 cm2
S' Lateral: 3.2 cm

## 2021-02-11 NOTE — Telephone Encounter (Signed)
-----   Message from Josue Hector, MD sent at 02/11/2021  2:56 PM EDT ----- Essentially normal EF normal trivial MR overall good

## 2021-02-11 NOTE — Telephone Encounter (Signed)
Pt notified and verbalized understanding.

## 2021-02-11 NOTE — Progress Notes (Signed)
*  PRELIMINARY RESULTS* Echocardiogram 2D Echocardiogram has been performed.  Justin Gill 02/11/2021, 12:10 PM

## 2021-02-21 ENCOUNTER — Other Ambulatory Visit: Payer: Self-pay

## 2021-02-21 ENCOUNTER — Ambulatory Visit (HOSPITAL_COMMUNITY)
Admission: RE | Admit: 2021-02-21 | Discharge: 2021-02-21 | Disposition: A | Discharge status: Home / Self Care | Payer: Medicare Other | Source: Ambulatory Visit | Attending: Cardiovascular Disease | Admitting: Cardiovascular Disease

## 2021-02-21 DIAGNOSIS — J432 Centrilobular emphysema: Secondary | ICD-10-CM | POA: Diagnosis not present

## 2021-02-21 DIAGNOSIS — I2510 Atherosclerotic heart disease of native coronary artery without angina pectoris: Secondary | ICD-10-CM | POA: Diagnosis not present

## 2021-02-21 DIAGNOSIS — F17200 Nicotine dependence, unspecified, uncomplicated: Secondary | ICD-10-CM

## 2021-02-21 DIAGNOSIS — F17210 Nicotine dependence, cigarettes, uncomplicated: Secondary | ICD-10-CM | POA: Insufficient documentation

## 2021-02-21 DIAGNOSIS — Z122 Encounter for screening for malignant neoplasm of respiratory organs: Secondary | ICD-10-CM | POA: Insufficient documentation

## 2021-02-21 DIAGNOSIS — I700 Atherosclerosis of aorta: Secondary | ICD-10-CM | POA: Insufficient documentation

## 2021-03-07 ENCOUNTER — Ambulatory Visit (INDEPENDENT_AMBULATORY_CARE_PROVIDER_SITE_OTHER): Payer: Medicare Other | Admitting: Licensed Clinical Social Worker

## 2021-03-07 DIAGNOSIS — Z720 Tobacco use: Secondary | ICD-10-CM

## 2021-03-07 DIAGNOSIS — I10 Essential (primary) hypertension: Secondary | ICD-10-CM

## 2021-03-07 DIAGNOSIS — J449 Chronic obstructive pulmonary disease, unspecified: Secondary | ICD-10-CM | POA: Diagnosis not present

## 2021-03-07 DIAGNOSIS — M25562 Pain in left knee: Secondary | ICD-10-CM

## 2021-03-07 DIAGNOSIS — G8929 Other chronic pain: Secondary | ICD-10-CM

## 2021-03-07 DIAGNOSIS — N529 Male erectile dysfunction, unspecified: Secondary | ICD-10-CM

## 2021-03-07 NOTE — Chronic Care Management (AMB) (Signed)
Chronic Care Management    Clinical Social Work Note  03/07/2021 Name: Justin Gill MRN: [id number] DOB: January 31, 1956  Justin Gill is a 65 y.o. year old male who is a primary care patient of Sharion Balloon, FNP. The CCM team was consulted to assist the patient with chronic disease management and/or care coordination needs related to: [employer] .   Engaged with patient by telephone for follow up visit in response to provider referral for social work chronic care management and care coordination services.   Consent to Services:  The patient was given information about Chronic Care Management services, agreed to services, and gave verbal consent prior to initiation of services.  Please see initial visit note for detailed documentation.   Patient agreed to services and consent obtained.   Assessment: Review of patient past medical history, allergies, medications, and health status, including review of relevant consultants reports was performed today as part of a comprehensive evaluation and provision of chronic care management and care coordination services.     SDOH (Social Determinants of Health) assessments and interventions performed: Client has Alcohol use challenges; he smokes cigarettes daily. He has financial challenges.   SDOH Interventions    Flowsheet Row Most Recent Value  SDOH Interventions   Depression Interventions/Treatment  --  [informed client of LCSW support and of RNCM support]        Advanced Directives Status: See Vynca application for related entries.  CCM Care Plan  Allergies  Allergen Reactions   Bee Venom Hives and Swelling    Outpatient Encounter Medications as of 03/07/2021  Medication Sig   albuterol (PROVENTIL HFA;VENTOLIN HFA) 108 (90 Base) MCG/ACT inhaler Inhale 2 puffs into the lungs every 6 (six) hours as needed for wheezing.   amLODipine (NORVASC) 10 MG tablet Take 1 tablet (10 mg total) by mouth daily.   budesonide-formoterol (SYMBICORT)  160-4.5 MCG/ACT inhaler Inhale 2 puffs into the lungs 2 (two) times daily.   Cyanocobalamin (B-12 PO) Take 1 tablet by mouth daily.   diphenhydrAMINE (BENADRYL) 25 MG tablet Take 25 mg by mouth every 6 (six) hours as needed.   ibuprofen (ADVIL) 800 MG tablet Take 1 tablet (800 mg total) by mouth every 8 (eight) hours as needed.   Multiple Vitamin (MULTIVITAMIN WITH MINERALS) TABS Take 1 tablet by mouth daily.   pyridOXINE (VITAMIN B-6) 100 MG tablet Take 100 mg by mouth daily.   sildenafil (VIAGRA) 100 MG tablet Take 1 tablet (100 mg total) by mouth as needed for erectile dysfunction.   triamcinolone cream (KENALOG) 0.1 % Apply 1 application topically daily as needed (Rash).   No facility-administered encounter medications on file as of 03/07/2021.    Patient Active Problem List   Diagnosis Date Noted   Atopic dermatitis 12/14/2020   Elevated hemoglobin ([place]) 12/14/2020   Alcohol dependence with unspecified alcohol-induced disorder ([place]) 11/30/2020   HTN (hypertension) 04/19/2015   COPD, moderate ([place]) 04/19/2015   Tobacco abuse 04/19/2015   Erectile dysfunction 04/19/2015    Conditions to be addressed/monitored: monitor client management of financial issues and medication procurement issues   Care Plan : LCSW: Community and Financial resources  Updates made by Katha Cabal, LCSW since 03/07/2021 12:00 AM     Problem: Coping Skills (General Plan of Care)      Goal: Coping Skills Enhanced: Client needs information on community resources and financial resources   Start Date: 03/07/2021  Expected End Date: 06/07/2021  This Visit's Progress: On track  Recent Progress: Not on  track  Priority: Medium  Note:   Current Barriers:  Patient with mobility challenges (arthritis) Patient with medication procurement challenges (difficulty affording his medications) Client has financial challenges Client has dental needs Decreased energy  Clinical Goal(s)  Over the next 30 days ,  patient will communicate with LCSW to discuss community resources of possible help to client  Client  will communicate as needed in next 30 days with RNCM to discuss nursing needs of client   Interventions provided by LCSW:  Collaboration with Sharion Balloon, FNP regarding development and update of comprehensive plan of care as evidenced by provider attestation and co-signature Assessed patient's care coordination needs related to mobility challenges, medication procurement issues and financial challenges of  client   Talked with client about decreased energy Talked with client about sleeping issues of client Talked with client about [place] and nursing support through CCM program Talked with client about food needs, transport needs and mobility challenges at home Talked with client about upcoming client appointments Talked with client about transport needs (client said he now has a vehicle and he can drive vehicle to appointments and to complete errands as needed) Talked with client about mood of client (client said he thought he was doing well with his mood at this time) Talked with client about medication costs. He said he has his prescribed medications; but, he said he sometimes has trouble paying copay amounts due on his prescribed medications.  Talked with Shanon Brow about pain issues. He discussed pain issues in his knees Talked with Shanon Brow about support from significant other, Natasha Bence Talked with Lucino about family contact (he said he has family members who call him regularly to communicate with him) Talked with Shanon Brow about CCM program support   Patient Coping Skills: Completes ADLs as needed Attends scheduled medical appointments  Patient Deficits:  Mobility issues Pain issues Financial challenges  Patient Goals:  Over the next 30 days, patient will:  - Acknowledge deficits and is motivated to resolve concern  - Contact RNCM or LCSW as needed to discuss client  needs  Attend scheduled medical appointments Take prescribed medications as ordered by medical provider  Follow Up Plan: LCSW to call client on 04/16/21 to assess client needs at that time      Norva Riffle.Jerelyn Trimarco MSW, LCSW Licensed Clinical Social Worker [facility] Care Management [phone]

## 2021-03-07 NOTE — Patient Instructions (Signed)
Visit Information  PATIENT GOALS:  Goals Addressed             This Visit's Progress    Manage My Emotions       Protect My Health;Manage Financial needs; Manage medication needs       Timeframe:  Short-Term Goal Priority:  Medium Progress: On Track Start Date:                   03/07/21       Expected End Date:      06/06/21      Follow Up Date 04/16/21   Protect My Health (Patient) Manage medication needs and financial needs    Why is this important?   Screening tests can find diseases early when they are easier to treat.  Your doctor or nurse will talk with you about which tests are important for you.  Getting shots for common diseases like the flu and shingles will help prevent them.     Patient Coping Skills: Completes ADLs as needed Attends scheduled medical appointment  Patient Deficits:  Mobility issues Pain issues Financial challenges  Patient Goals : Over the next 30 days, patient will:   - Acknowledge deficits and is motivated to resolve concern  - Contact RNCM or LCSW as needed to discuss client needs  Attend scheduled medical appointments Take prescribed medications as ordered by medical provider  Follow Up Plan: LCSW to call client on 04/16/21 to assess client needs at that time    Norva Riffle.Kalicia Dufresne MSW, LCSW Licensed Clinical Social Worker [facility] Care Management [phone]

## 2021-04-16 ENCOUNTER — Ambulatory Visit (INDEPENDENT_AMBULATORY_CARE_PROVIDER_SITE_OTHER): Payer: Medicare Other | Admitting: Licensed Clinical Social Worker

## 2021-04-16 DIAGNOSIS — G8929 Other chronic pain: Secondary | ICD-10-CM

## 2021-04-16 DIAGNOSIS — N529 Male erectile dysfunction, unspecified: Secondary | ICD-10-CM

## 2021-04-16 DIAGNOSIS — J449 Chronic obstructive pulmonary disease, unspecified: Secondary | ICD-10-CM

## 2021-04-16 DIAGNOSIS — F1029 Alcohol dependence with unspecified alcohol-induced disorder: Secondary | ICD-10-CM

## 2021-04-16 DIAGNOSIS — Z720 Tobacco use: Secondary | ICD-10-CM

## 2021-04-16 DIAGNOSIS — I10 Essential (primary) hypertension: Secondary | ICD-10-CM

## 2021-04-16 NOTE — Patient Instructions (Signed)
Visit Information  PATIENT GOALS:  Goals Addressed             This Visit's Progress    Protect My Health;Manage Financial needs; Manage medication needs       Timeframe:  Short-Term Goal Priority:  High Progress: Not On Track Start Date:                   03/07/21       Expected End Date:      07/04/21      Follow Up Date 05/29/21 at 1:30 PM   Protect My Health (Patient) Manage medication needs and financial needs .   Why is this important?   Screening tests can find diseases early when they are easier to treat.  Your doctor or nurse will talk with you about which tests are important for you.  Getting shots for common diseases like the flu and shingles will help prevent them.     Patient Coping Skills: Completes ADLs as needed Attends scheduled medical appointment  Patient Deficits:  Mobility issues Pain issues Financial challenges  Patient Goals : Over the next 30 days, patient will:   - Acknowledge deficits and is motivated to resolve concern  - Contact RNCM or LCSW as needed to discuss client needs  Attend scheduled medical appointments Take prescribed medications as ordered by medical provider  Follow Up Plan: LCSW to call client on 05/29/21 at 1:30 PM to assess client needs at that time     Norva Riffle.Geneva Barrero MSW, LCSW Licensed Clinical Social Worker [facility] Care Management [phone]

## 2021-04-16 NOTE — Chronic Care Management (AMB) (Signed)
Chronic Care Management    Clinical Social Work Note  04/16/2021 Name: Justin Gill MRN: [id number] DOB: 1956-05-06  Justin Gill is a 65 y.o. year old male who is a primary care patient of Sharion Balloon, FNP. The CCM team was consulted to assist the patient with chronic disease management and/or care coordination needs related to: [employer] .   Engaged with patient by telephone for follow up visit in response to provider referral for social work chronic care management and care coordination services.   Consent to Services:  The patient was given information about Chronic Care Management services, agreed to services, and gave verbal consent prior to initiation of services.  Please see initial visit note for detailed documentation.   Patient agreed to services and consent obtained.   Assessment: Review of patient past medical history, allergies, medications, and health status, including review of relevant consultants reports was performed today as part of a comprehensive evaluation and provision of chronic care management and care coordination services.     SDOH (Social Determinants of Health) assessments and interventions performed:  SDOH Interventions    Flowsheet Row Most Recent Value  SDOH Interventions   Stress Interventions Provide Counseling  [client has stress related to paying medical bills. He has trouble paying copay amounts on doctor visits, medical tests and medication costs. Financial strain experienced]  Depression Interventions/Treatment  --  [informed client of Justin Gill support and of RNCM support]        Advanced Directives Status: See Vynca application for related entries.  CCM Care Plan  Allergies  Allergen Reactions   Bee Venom Hives and Swelling    Outpatient Encounter Medications as of 04/16/2021  Medication Sig   albuterol (PROVENTIL HFA;VENTOLIN HFA) 108 (90 Base) MCG/ACT inhaler Inhale 2 puffs into the lungs every 6 (six) hours as needed for  wheezing.   amLODipine (NORVASC) 10 MG tablet Take 1 tablet (10 mg total) by mouth daily.   budesonide-formoterol (SYMBICORT) 160-4.5 MCG/ACT inhaler Inhale 2 puffs into the lungs 2 (two) times daily.   Cyanocobalamin (B-12 PO) Take 1 tablet by mouth daily.   diphenhydrAMINE (BENADRYL) 25 MG tablet Take 25 mg by mouth every 6 (six) hours as needed.   ibuprofen (ADVIL) 800 MG tablet Take 1 tablet (800 mg total) by mouth every 8 (eight) hours as needed.   Multiple Vitamin (MULTIVITAMIN WITH MINERALS) TABS Take 1 tablet by mouth daily.   pyridOXINE (VITAMIN B-6) 100 MG tablet Take 100 mg by mouth daily.   sildenafil (VIAGRA) 100 MG tablet Take 1 tablet (100 mg total) by mouth as needed for erectile dysfunction.   triamcinolone cream (KENALOG) 0.1 % Apply 1 application topically daily as needed (Rash).   No facility-administered encounter medications on file as of 04/16/2021.    Patient Active Problem List   Diagnosis Date Noted   Atopic dermatitis 12/14/2020   Elevated hemoglobin (HCC) 12/14/2020   Alcohol dependence with unspecified alcohol-induced disorder ([place]) 11/30/2020   HTN (hypertension) 04/19/2015   COPD, moderate ([place]) 04/19/2015   Tobacco abuse 04/19/2015   Erectile dysfunction 04/19/2015    Conditions to be addressed/monitored: monitor client management of financial needs.  Care Plan : Justin Gill: Community and Financial resources  Updates made by Katha Cabal, Justin Gill since 04/16/2021 12:00 AM     Problem: Coping Skills (General Plan of Care)      Goal: Coping Skills Enhanced: Client needs information on community resources and financial resources   Start Date: 03/07/2021  Expected End Date:  07/04/2021  This Visit's Progress: Not on track  Recent Progress: On track  Priority: High  Note:   Current Barriers:  Patient with mobility challenges (arthritis) Patient with medication procurement challenges (difficulty affording his medications) Client has financial  challenges Client has dental needs Decreased energy  Clinical Goal(s)  Over the next 30 days , patient will communicate with Justin Gill to discuss community resources of possible help to client  Client  will communicate as needed in next 30 days with RNCM to discuss nursing needs of client  Client will talk with Justin Gill in next 30 days to discuss financial needs of client  Interventions provided by Justin Gill:  Collaboration with Sharion Balloon, FNP regarding development and update of comprehensive plan of care as evidenced by provider attestation and co-signature Talked with client about decreased energy Talked with client about sleeping issues of client Talked with client about RNCM Chong Sicilian and nursing support through [facility] program Talked with client about upcoming client appointments. Client has appointment with Evelina Dun, FNP on 06/17/21.  He plans to have lab work done at that appointment. He asked that two other appointments scheduled for him prior to his appointment with [facility] on 06/17/21 be cancelled. Justin Gill collaborated today with LPN Dewain Penning with client permission regarding client needs Jan and Justin Gill talked via phone today with client about his appointments. Dewain Penning LPN informed client today that she would cancel two appointments for client in November of 2022 (at request of client). Thus client would just go to his appointment with Evelina Dun on 06/17/21. Client agreed to this plan Justin Gill talked with client about his mood. Client seemed angry on the phone and was frustrated about his frequent medical bills received. He said he only received a monthly Social Security benefit and it was very difficult for him to pay these medical bills received. Talked with client about family support Talked with client about transport needs (client said he now has a vehicle and he can drive vehicle to appointments and to complete errands as needed) Talked with Shanon Brow about CCM program support  Provided  counseling support for client  Patient Coping Skills: Completes ADLs as needed Attends scheduled medical appointments  Patient Deficits:  Mobility issues Pain issues Financial challenges  Patient Goals:  Over the next 30 days, patient will:  - Acknowledge deficits and is motivated to resolve concern  - Contact RNCM or Justin Gill as needed to discuss client needs  Attend scheduled medical appointments Take prescribed medications as ordered by medical provider  Follow Up Plan: Justin Gill to call client on 05/29/21 at 1:30 PM to assess client needs at that time      Justin Gill Gill, Justin Gill Licensed Clinical Social Worker [facility] Care Management [phone]

## 2021-04-17 ENCOUNTER — Ambulatory Visit: Payer: Medicare Other | Admitting: Family Medicine

## 2021-04-26 DIAGNOSIS — J449 Chronic obstructive pulmonary disease, unspecified: Secondary | ICD-10-CM | POA: Diagnosis not present

## 2021-04-26 DIAGNOSIS — I10 Essential (primary) hypertension: Secondary | ICD-10-CM

## 2021-05-28 ENCOUNTER — Other Ambulatory Visit (HOSPITAL_COMMUNITY): Payer: Medicare Other

## 2021-05-29 ENCOUNTER — Telehealth: Payer: Medicare Other

## 2021-06-01 ENCOUNTER — Other Ambulatory Visit: Payer: Self-pay | Admitting: Family

## 2021-06-01 DIAGNOSIS — I10 Essential (primary) hypertension: Secondary | ICD-10-CM

## 2021-06-04 ENCOUNTER — Ambulatory Visit (HOSPITAL_COMMUNITY): Payer: Medicare Other | Admitting: Physician Assistant

## 2021-06-17 ENCOUNTER — Ambulatory Visit: Payer: Medicare Other | Admitting: Family

## 2021-07-01 ENCOUNTER — Encounter: Payer: Self-pay | Admitting: Family

## 2021-07-01 ENCOUNTER — Other Ambulatory Visit: Payer: Self-pay | Admitting: Family

## 2021-07-01 ENCOUNTER — Ambulatory Visit (INDEPENDENT_AMBULATORY_CARE_PROVIDER_SITE_OTHER): Payer: Medicare Other | Admitting: Family

## 2021-07-01 VITALS — BP 134/87 | HR 73 | Temp 97.1°F | Ht 71.0 in | Wt 183.4 lb

## 2021-07-01 DIAGNOSIS — N522 Drug-induced erectile dysfunction: Secondary | ICD-10-CM

## 2021-07-01 DIAGNOSIS — I10 Essential (primary) hypertension: Secondary | ICD-10-CM | POA: Diagnosis not present

## 2021-07-01 DIAGNOSIS — R609 Edema, unspecified: Secondary | ICD-10-CM | POA: Diagnosis not present

## 2021-07-01 DIAGNOSIS — F1029 Alcohol dependence with unspecified alcohol-induced disorder: Secondary | ICD-10-CM

## 2021-07-01 DIAGNOSIS — G8929 Other chronic pain: Secondary | ICD-10-CM

## 2021-07-01 DIAGNOSIS — J449 Chronic obstructive pulmonary disease, unspecified: Secondary | ICD-10-CM | POA: Diagnosis not present

## 2021-07-01 DIAGNOSIS — M25562 Pain in left knee: Secondary | ICD-10-CM | POA: Diagnosis not present

## 2021-07-01 DIAGNOSIS — M25561 Pain in right knee: Secondary | ICD-10-CM | POA: Diagnosis not present

## 2021-07-01 DIAGNOSIS — Z720 Tobacco use: Secondary | ICD-10-CM | POA: Diagnosis not present

## 2021-07-01 MED ORDER — AMLODIPINE BESYLATE 10 MG PO TABS: 10.0000 mg | ORAL_TABLET | Freq: Every day | ORAL | 1 refills | Status: DC

## 2021-07-01 MED ORDER — BUDESONIDE-FORMOTEROL FUMARATE 160-4.5 MCG/ACT IN AERO: 2.0000 | INHALATION_SPRAY | Freq: Two times a day (BID) | RESPIRATORY_TRACT | 3 refills | Status: DC

## 2021-07-01 MED ORDER — TRIAMCINOLONE ACETONIDE 0.1 % EX CREA: 1.0000 "application " | TOPICAL_CREAM | Freq: Every day | CUTANEOUS | 2 refills | Status: DC | PRN

## 2021-07-01 MED ORDER — ALBUTEROL SULFATE HFA 108 (90 BASE) MCG/ACT IN AERS: 2.0000 | INHALATION_SPRAY | Freq: Four times a day (QID) | RESPIRATORY_TRACT | 2 refills | Status: DC | PRN

## 2021-07-01 MED ORDER — SILDENAFIL CITRATE 100 MG PO TABS: 100.0000 mg | ORAL_TABLET | ORAL | 1 refills | Status: DC | PRN

## 2021-07-01 MED ORDER — IBUPROFEN 800 MG PO TABS: 800.0000 mg | ORAL_TABLET | Freq: Three times a day (TID) | ORAL | 5 refills | Status: DC | PRN

## 2021-07-01 NOTE — Patient Instructions (Addendum)
Peripheral Edema °Peripheral edema is swelling that is caused by a buildup of fluid. Peripheral edema most often affects the lower legs, ankles, and feet. It can also develop in the arms, hands, and face. The area of the body that has peripheral edema will look swollen. It may also feel heavy or warm. Your clothes may start to feel tight. Pressing on the area may make a temporary dent in your skin. You may not be able to move your swollen arm or leg as much as usual. °There are many causes of peripheral edema. It can happen because of a complication of other conditions such as congestive heart failure, kidney disease, or a problem with your blood circulation. It also can be a side effect of certain medicines or because of an infection. It often happens to women during pregnancy. Sometimes, the cause is not known. °Follow these instructions at home: °Managing pain, stiffness, and swelling ° °Raise (elevate) your legs while you are sitting or lying down. °Move around often to prevent stiffness and to lessen swelling. °Do not sit or stand for long periods of time. °Wear support stockings as told by your health care provider. °Medicines °Take over-the-counter and prescription medicines only as told by your health care provider. °Your health care provider may prescribe medicine to help your body get rid of excess water (diuretic). °General instructions °Pay attention to any changes in your symptoms. °Follow instructions from your health care provider about limiting salt (sodium) in your diet. Sometimes, eating less salt may reduce swelling. °Moisturize skin daily to help prevent skin from cracking and draining. °Keep all follow-up visits as told by your health care provider. This is important. °Contact a health care provider if you have: °A fever. °Edema that starts suddenly or is getting worse, especially if you are pregnant or have a medical condition. °Swelling in only one leg. °Increased swelling, redness, or pain in  one or both of your legs. °Drainage or sores at the area where you have edema. °Get help right away if you: °Develop shortness of breath, especially when you are lying down. °Have pain in your chest or abdomen. °Feel weak. °Feel faint. °Summary °Peripheral edema is swelling that is caused by a buildup of fluid. Peripheral edema most often affects the lower legs, ankles, and feet. °Move around often to prevent stiffness and to lessen swelling. Do not sit or stand for long periods of time. °Pay attention to any changes in your symptoms. °Contact a health care provider if you have edema that starts suddenly or is getting worse, especially if you are pregnant or have a medical condition. °Get help right away if you develop shortness of breath, especially when lying down. °This information is not intended to replace advice given to you by your health care provider. Make sure you discuss any questions you have with your health care provider. °Document Revised: 12/13/2020 Document Reviewed: 04/07/2018 °Elsevier Patient Education © 2022 Elsevier Inc. ° °

## 2021-07-01 NOTE — Addendum Note (Signed)
Addended by: Brynda Peon F on: 07/01/2021 01:05 PM   Modules accepted: Orders

## 2021-07-01 NOTE — Progress Notes (Signed)
Subjective:    Patient ID: Justin Gill, male    DOB: 10-Apr-1956, 65 y.o.   MRN: [id number]  Chief Complaint  Patient presents with   Medical Management of Chronic Issues   PT presents to the office today for chronic follow up. He saw a Cardiologists that cleared him.   He is a current smoker for 46 years and currently smoke 1/2 pack a day. He also drinks 40 oz of beer a day. He is not very active.   He has COPD and states this is stable. He is currently taking Symbicort BID as needed.   He has ED and takes Viagra as needed. States this was working well, until we increased his BP medications on last visit. He has went back to work and has had increased stress.   While working he has increased edema that goes down after her rests.  Hypertension This is a chronic problem. The current episode started more than 1 year ago. The problem has been waxing and waning since onset. The problem is uncontrolled. Associated symptoms include peripheral edema. Pertinent negatives include no malaise/fatigue or shortness of breath. Risk factors for coronary artery disease include dyslipidemia, obesity and male gender. The current treatment provides moderate improvement.  Nicotine Dependence Presents for follow-up visit. His urge triggers include company of smokers. He smokes < 1/2 a pack of cigarettes per day.     Review of Systems  Constitutional:  Negative for malaise/fatigue.  Respiratory:  Negative for shortness of breath.   All other systems reviewed and are negative.     Objective:   Physical Exam Vitals reviewed.  Constitutional:      General: He is not in acute distress.    Appearance: He is well-developed.  HENT:     Head: Normocephalic.     Right Ear: Tympanic membrane normal.     Left Ear: Tympanic membrane normal.  Eyes:     General:        Right eye: No discharge.        Left eye: No discharge.     Pupils: Pupils are equal, round, and reactive to light.  Neck:     Thyroid: No  thyromegaly.  Cardiovascular:     Rate and Rhythm: Normal rate and regular rhythm.     Heart sounds: Normal heart sounds. No murmur heard. Pulmonary:     Effort: Pulmonary effort is normal. No respiratory distress.     Breath sounds: Normal breath sounds. No wheezing.  Abdominal:     General: Bowel sounds are normal. There is no distension.     Palpations: Abdomen is soft.     Tenderness: There is no abdominal tenderness.  Musculoskeletal:        General: No tenderness. Normal range of motion.     Cervical back: Normal range of motion and neck supple.  Skin:    General: Skin is warm and dry.     Findings: No erythema or rash.  Neurological:     Mental Status: He is alert and oriented to person, place, and time.     Cranial Nerves: No cranial nerve deficit.     Deep Tendon Reflexes: Reflexes are normal and symmetric.  Psychiatric:        Behavior: Behavior normal.        Thought Content: Thought content normal.        Judgment: Judgment normal.         BP 134/87   Pulse 73   Temp Marland Kitchen)  97.1 F (36.2 C) (Temporal)   Ht _0  (1.803 m)   Wt 183 lb 6.4 oz (83.2 kg)   BMI 25.58 kg/m   Assessment & Plan:  [name] comes in today with chief complaint of Medical Management of Chronic Issues   Diagnosis and orders addressed:  1. COPD, moderate (HCC) - budesonide-formoterol (SYMBICORT) 160-4.5 MCG/ACT inhaler; Inhale 2 puffs into the lungs 2 (two) times daily.  Dispense: 1 each; Refill: 3 - albuterol (VENTOLIN HFA) 108 (90 Base) MCG/ACT inhaler; Inhale 2 puffs into the lungs every 6 (six) hours as needed for wheezing.  Dispense: 1 each; Refill: 2 - CBC with Differential/Platelet - BMP8+EGFR - Hepatic function panel  2. Drug-induced erectile dysfunction  - sildenafil (VIAGRA) 100 MG tablet; Take 1 tablet (100 mg total) by mouth as needed for erectile dysfunction.  Dispense: 30 tablet; Refill: 1 - CBC with Differential/Platelet - BMP8+EGFR - Hepatic function panel  3.  Primary hypertension - amLODipine (NORVASC) 10 MG tablet; Take 1 tablet (10 mg total) by mouth daily.  Dispense: 90 tablet; Refill: 1 - CBC with Differential/Platelet - BMP8+EGFR - Hepatic function panel  4. Peripheral edema Start compression hose  Keep elevated  Low salt - Compression stockings - CBC with Differential/Platelet - BMP8+EGFR - Hepatic function panel  5. Alcohol dependence with unspecified alcohol-induced disorder ([place]) - CBC with Differential/Platelet - BMP8+EGFR - Hepatic function panel  6. Tobacco abuse - CBC with Differential/Platelet - BMP8+EGFR - Hepatic function panel   Labs pending Health Maintenance reviewed Diet and exercise encouraged  Follow up plan: 6 months   Evelina Dun, FNP

## 2021-07-02 LAB — CBC WITH DIFFERENTIAL/PLATELET
Basophils Absolute: 0.1 10*3/uL (ref 0.0–0.2)
Basos: 1 %
EOS (ABSOLUTE): 0.3 10*3/uL (ref 0.0–0.4)
Eos: 8 %
Hematocrit: 49.4 % (ref 37.5–51.0)
Hemoglobin: 17.2 g/dL (ref 13.0–17.7)
Immature Grans (Abs): 0 10*3/uL (ref 0.0–0.1)
Immature Granulocytes: 1 %
Lymphocytes Absolute: 1.6 10*3/uL (ref 0.7–3.1)
Lymphs: 39 %
MCH: 32 pg (ref 26.6–33.0)
MCHC: 34.8 g/dL (ref 31.5–35.7)
MCV: 92 fL (ref 79–97)
Monocytes Absolute: 0.5 10*3/uL (ref 0.1–0.9)
Monocytes: 13 %
Neutrophils Absolute: 1.5 10*3/uL (ref 1.4–7.0)
Neutrophils: 38 %
Platelets: 286 10*3/uL (ref 150–450)
RBC: 5.38 x10E6/uL (ref 4.14–5.80)
RDW: 11.3 % — ABNORMAL LOW (ref 11.6–15.4)
WBC: 4.1 10*3/uL (ref 3.4–10.8)

## 2021-07-02 LAB — HEPATIC FUNCTION PANEL
ALT: 17 IU/L (ref 0–44)
AST: 19 IU/L (ref 0–40)
Albumin: 4 g/dL (ref 3.8–4.8)
Alkaline Phosphatase: 54 IU/L (ref 44–121)
Bilirubin Total: 0.6 mg/dL (ref 0.0–1.2)
Bilirubin, Direct: 0.15 mg/dL (ref 0.00–0.40)
Total Protein: 6.5 g/dL (ref 6.0–8.5)

## 2021-07-02 LAB — BMP8+EGFR
BUN/Creatinine Ratio: 9 — ABNORMAL LOW (ref 10–24)
BUN: 7 mg/dL — ABNORMAL LOW (ref 8–27)
CO2: 24 mmol/L (ref 20–29)
Calcium: 9.2 mg/dL (ref 8.6–10.2)
Chloride: 102 mmol/L (ref 96–106)
Creatinine, Ser: 0.8 mg/dL (ref 0.76–1.27)
Glucose: 106 mg/dL — ABNORMAL HIGH (ref 70–99)
Potassium: 4.1 mmol/L (ref 3.5–5.2)
Sodium: 138 mmol/L (ref 134–144)
eGFR: 98 mL/min/{1.73_m2} (ref >59)

## 2021-07-25 ENCOUNTER — Ambulatory Visit (INDEPENDENT_AMBULATORY_CARE_PROVIDER_SITE_OTHER): Payer: Medicare Other | Admitting: Licensed Clinical Social Worker

## 2021-07-25 DIAGNOSIS — F1029 Alcohol dependence with unspecified alcohol-induced disorder: Secondary | ICD-10-CM

## 2021-07-25 DIAGNOSIS — J449 Chronic obstructive pulmonary disease, unspecified: Secondary | ICD-10-CM

## 2021-07-25 DIAGNOSIS — N529 Male erectile dysfunction, unspecified: Secondary | ICD-10-CM

## 2021-07-25 DIAGNOSIS — M25562 Pain in left knee: Secondary | ICD-10-CM

## 2021-07-25 DIAGNOSIS — Z720 Tobacco use: Secondary | ICD-10-CM

## 2021-07-25 DIAGNOSIS — I10 Essential (primary) hypertension: Secondary | ICD-10-CM

## 2021-07-25 NOTE — Patient Instructions (Addendum)
Visit Information  Patient Goals:  Find Help in My Community (Patient). Patient needs help with community resources and financial resources  Timeframe:  Short-Term Goal Priority:  Medium Progress:  On Track Start Date:       09/11/2020                      Expected End Date:      07/25/21                Follow UP Date: LCSW is discharging client today from CCM services at client request. Client said he has no further CCM needs at present    Find Help in My Community (Patient) Patient needs help with community resources and financial resources    Why is this important?   Knowing how and where to find help for yourself or family in your neighborhood and community is an important skill.  You will want to take some steps to learn how.   Patient Coping Skills  Has part time job which helps him financially Has no transport needs  Patient Deficits:  Some pain issus  Patient Goals:   Over the next 30 days, patient will:  Acknowledge deficits and is motivated to resolve concern  Attend scheduled medical appointments Take prescribed medications as ordered by medical provider   Follow Up Plan:  LCSW is discharging client today from CCM services per client request.  Client said he has no further CCM needs at this time.  Norva Riffle.Nas Wafer MSW, LCSW Licensed Clinical Social Worker [facility] Care Management [phone]

## 2021-07-25 NOTE — Chronic Care Management (AMB) (Signed)
Chronic Care Management    Clinical Social Work Note  07/25/2021 Name: Justin Gill MRN: [id number] DOB: February 12, 1956  Justin Gill is a 65 y.o. year old male who is a primary care patient of Justin Balloon, FNP. The CCM team was consulted to assist the patient with chronic disease management and/or care coordination needs related to: [employer] .   Engaged with patient by telephone for follow up visit in response to provider referral for social work chronic care management and care coordination services.   Consent to Services:  The patient was given information about Chronic Care Management services, agreed to services, and gave verbal consent prior to initiation of services.  Please see initial visit note for detailed documentation.   Patient agreed to services and consent obtained.   Assessment: Review of patient past medical history, allergies, medications, and health status, including review of relevant consultants reports was performed today as part of a comprehensive evaluation and provision of chronic care management and care coordination services.     SDOH (Social Determinants of Health) assessments and interventions performed:  SDOH Interventions    Flowsheet Row Most Recent Value  SDOH Interventions   Physical Activity Interventions Other (Comments)  [client has occasional knee pain issues]  Stress Interventions Other (Comment)  [client has stress related to managing medical needs]  Depression Interventions/Treatment  --  [informed client of LCSW support and of RNCM support]        Advanced Directives Status: See Vynca application for related entries.  CCM Care Plan  Allergies  Allergen Reactions   Bee Venom Hives and Swelling    Outpatient Encounter Medications as of 07/25/2021  Medication Sig   albuterol (VENTOLIN HFA) 108 (90 Base) MCG/ACT inhaler Inhale 2 puffs into the lungs every 6 (six) hours as needed for wheezing.   amLODipine (NORVASC) 10 MG  tablet Take 1 tablet (10 mg total) by mouth daily.   budesonide-formoterol (SYMBICORT) 160-4.5 MCG/ACT inhaler Inhale 2 puffs into the lungs 2 (two) times daily.   Cyanocobalamin (B-12 PO) Take 1 tablet by mouth daily.   diphenhydrAMINE (BENADRYL) 25 MG tablet Take 25 mg by mouth every 6 (six) hours as needed.   ibuprofen (ADVIL) 800 MG tablet Take 1 tablet (800 mg total) by mouth every 8 (eight) hours as needed.   Multiple Vitamin (MULTIVITAMIN WITH MINERALS) TABS Take 1 tablet by mouth daily.   pyridOXINE (VITAMIN B-6) 100 MG tablet Take 100 mg by mouth daily.   sildenafil (VIAGRA) 100 MG tablet Take 1 tablet (100 mg total) by mouth as needed for erectile dysfunction.   triamcinolone cream (KENALOG) 0.1 % Apply 1 application topically daily as needed (Rash).   No facility-administered encounter medications on file as of 07/25/2021.    Patient Active Problem List   Diagnosis Date Noted   Atopic dermatitis 12/14/2020   Elevated hemoglobin ([place]) 12/14/2020   Alcohol dependence with unspecified alcohol-induced disorder ([place]) 11/30/2020   HTN (hypertension) 04/19/2015   COPD, moderate ([place]) 04/19/2015   Tobacco abuse 04/19/2015   Erectile dysfunction 04/19/2015    Conditions to be addressed/monitored: monitor financial needs of client  Care Plan : LCSW: Community and Financial resources  Updates made by Justin Cabal, LCSW since 07/25/2021 12:00 AM     Problem: Coping Skills (General Plan of Care)      Goal: Coping Skills Enhanced: Client needs information on community resources and financial resources   Start Date: 09/18/20  Expected End Date:  07/25/21  This Visit's Progress:  On track  Recent Progress: Not on track  Priority: Medium  Note:   Current Barriers:  Patient with mobility challenges (arthritis) Patient with medication procurement challenges (difficulty affording his medications) Decreased energy  Clinical Goal(s)  Over the next 30 days , patient will  communicate with LCSW to discuss community resources of possible help to client  Client  will communicate as needed in next 30 days with RNCM to discuss nursing needs of client   Interventions provided by LCSW:  Collaboration with Justin Balloon, FNP regarding development and update of comprehensive plan of care as evidenced by provider attestation and co-signature Discussed client needs with Justin Gill. Discussed energy level of client. Justin Gill said he sometimes is fatigued and sometimes feels like he has reduced energy Discussed part time job of client. 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 He is glad he has part time job. He said this job helps him out financially.  Reviewed sleeping issues of client Reviewed transport needs of client.  Discussed CCM program support with Justin Gill and LCSW discussed CCM program support for client. Justin Gill said since he has a part time job this had helped him financially. He said he did not have any nursing needs at present. He asked to be discharged from [facility] Justin Gill program services at this time since he did not have any current needs to be addressed. LCSW informed Justin Gill that LCSW would discharge client today from CCM program services at client request. Client agreed to this plan  Patient Coping Skills: Completes ADLs as needed Attends scheduled medical appointments  Patient Deficits:  Mobility issues Pain issues Financial challenges  Patient Goals:  Over the next 30 days, patient will:   Acknowledge deficits and is motivated to resolve concern  Attend scheduled medical appointments Take prescribed medications as ordered by medical provider  Follow Up Plan: LCSW  is discharging client today from CCM services at request of client. Client said he has no further CCM needs at present.      Justin Gill MSW, LCSW Licensed Clinical Social Worker [facility] Care Management [phone]

## 2021-07-27 DIAGNOSIS — I10 Essential (primary) hypertension: Secondary | ICD-10-CM

## 2021-07-27 DIAGNOSIS — J449 Chronic obstructive pulmonary disease, unspecified: Secondary | ICD-10-CM

## 2021-09-01 ENCOUNTER — Other Ambulatory Visit: Payer: Self-pay | Admitting: Family

## 2021-09-01 DIAGNOSIS — N522 Drug-induced erectile dysfunction: Secondary | ICD-10-CM

## 2021-09-02 ENCOUNTER — Telehealth: Payer: Self-pay | Admitting: Family

## 2021-09-02 ENCOUNTER — Ambulatory Visit: Payer: Medicare Other

## 2021-09-02 NOTE — Telephone Encounter (Signed)
Pt requested that PCP send refills in for this Rx today before lunch time.

## 2021-09-24 ENCOUNTER — Telehealth: Payer: Self-pay | Admitting: Family

## 2021-09-24 NOTE — Telephone Encounter (Signed)
Patient declined the Medicare Wellness Visit with [facility]  due to work schedule. Do not call back he is not interested in this program

## 2021-11-12 ENCOUNTER — Encounter: Payer: Self-pay | Admitting: *Deleted

## 2021-12-30 ENCOUNTER — Encounter: Payer: Self-pay | Admitting: Family

## 2021-12-30 ENCOUNTER — Ambulatory Visit (INDEPENDENT_AMBULATORY_CARE_PROVIDER_SITE_OTHER): Payer: BC Managed Care – PPO | Admitting: Family

## 2021-12-30 VITALS — BP 114/66 | HR 76 | Temp 98.4°F | Ht 71.0 in | Wt 179.0 lb

## 2021-12-30 DIAGNOSIS — I10 Essential (primary) hypertension: Secondary | ICD-10-CM

## 2021-12-30 DIAGNOSIS — N529 Male erectile dysfunction, unspecified: Secondary | ICD-10-CM | POA: Diagnosis not present

## 2021-12-30 DIAGNOSIS — Z720 Tobacco use: Secondary | ICD-10-CM

## 2021-12-30 DIAGNOSIS — Z23 Encounter for immunization: Secondary | ICD-10-CM | POA: Diagnosis not present

## 2021-12-30 DIAGNOSIS — J449 Chronic obstructive pulmonary disease, unspecified: Secondary | ICD-10-CM | POA: Diagnosis not present

## 2021-12-30 DIAGNOSIS — K047 Periapical abscess without sinus: Secondary | ICD-10-CM

## 2021-12-30 DIAGNOSIS — L209 Atopic dermatitis, unspecified: Secondary | ICD-10-CM | POA: Diagnosis not present

## 2021-12-30 DIAGNOSIS — F1029 Alcohol dependence with unspecified alcohol-induced disorder: Secondary | ICD-10-CM

## 2021-12-30 DIAGNOSIS — N522 Drug-induced erectile dysfunction: Secondary | ICD-10-CM

## 2021-12-30 MED ORDER — ALBUTEROL SULFATE HFA 108 (90 BASE) MCG/ACT IN AERS: 2.0000 | INHALATION_SPRAY | Freq: Four times a day (QID) | RESPIRATORY_TRACT | 2 refills | Status: DC | PRN

## 2021-12-30 MED ORDER — TRIAMCINOLONE ACETONIDE 0.1 % EX CREA: 1.0000 "application " | TOPICAL_CREAM | Freq: Every day | CUTANEOUS | 2 refills | Status: DC | PRN

## 2021-12-30 MED ORDER — AMOXICILLIN-POT CLAVULANATE 875-125 MG PO TABS: 1.0000 | ORAL_TABLET | Freq: Two times a day (BID) | ORAL | 0 refills | Status: DC

## 2021-12-30 MED ORDER — BUDESONIDE-FORMOTEROL FUMARATE 160-4.5 MCG/ACT IN AERO: 2.0000 | INHALATION_SPRAY | Freq: Two times a day (BID) | RESPIRATORY_TRACT | 3 refills | Status: DC

## 2021-12-30 MED ORDER — SILDENAFIL CITRATE 100 MG PO TABS: ORAL_TABLET | ORAL | 1 refills | Status: DC

## 2021-12-30 MED ORDER — AMLODIPINE BESYLATE 10 MG PO TABS: 10.0000 mg | ORAL_TABLET | Freq: Every day | ORAL | 1 refills | Status: DC

## 2021-12-30 NOTE — Patient Instructions (Signed)
Dental Abscess  A dental abscess is an infection around a tooth that may involve pain, swelling, and a collection of pus, as well as other symptoms. Treatment is important to help with symptoms and to prevent the infection from spreading. The general types of dental abscesses are: Pulpal abscess. This abscess may form from the inner part of the tooth (pulp). Periodontal abscess. This abscess may form from the gum. What are the causes? This condition is caused by a bacterial infection in or around the tooth. It may result from: Severe tooth decay (cavities). Trauma to the tooth, such as a broken or chipped tooth. What increases the risk? This condition is more likely to develop in males. It is also more likely to develop in people who: Have cavities. Have severe gum disease. Eat sugary snacks between meals. Use tobacco products. Have diabetes. Have a weakened disease-fighting system (immune system). Do not brush and care for their teeth regularly. What are the signs or symptoms? Mild symptoms of this condition include: Tenderness. Bad breath. Fever. A bitter taste in the mouth. Pain in and around the infected tooth. Moderate symptoms of this condition include: Swollen neck glands. Chills. Pus drainage. Swelling and redness around the infected tooth, in the mouth, or in the face. Severe pain in and around the infected tooth. Severe symptoms of this condition include: Difficulty swallowing. Difficulty opening the mouth. Nausea. Vomiting. How is this diagnosed? This condition is diagnosed based on: Your symptoms and your medical and dental history. An examination of the infected tooth. During the exam, your dental care provider may tap on the infected tooth. You may also need to have X-rays taken of the affected area. How is this treated? This condition is treated by getting rid of the infection. This may be done with: Antibiotic medicines. These may be used in certain  situations. Antibacterial mouth rinse. Incision and drainage. This procedure is done by making an incision in the abscess to drain out the pus. Removing pus is the first priority in treating an abscess. A root canal. This may be performed to save the tooth. Your dental care provider accesses the visible part of your tooth (crown) with a drill and removes any infected pulp. Then the space is filled and sealed off. Tooth extraction. The tooth is pulled out if it cannot be saved by other treatment. You may also receive treatment for pain, such as: Acetaminophen or NSAIDs. Gels that contain a numbing medicine. An injection to block the pain near your nerve. Follow these instructions at home: Medicines Take over-the-counter and prescription medicines only as told by your dental care provider. If you were prescribed an antibiotic, take it as told by your dental care provider. Do not stop taking the antibiotic even if you start to feel better. If you were prescribed a gel that contains a numbing medicine, use it exactly as told in the directions. Do not use these gels for children who are younger than 2 years of age. Use an antibacterial mouth rinse as told by your dental care provider. General instructions  Gargle with a mixture of salt and water 3-4 times a day or as needed. To make salt water, completely dissolve -1 tsp (3-6 g) of salt in 1 cup (237 mL) of warm water. Eat a soft diet while your abscess is healing. Drink enough fluid to keep your urine pale yellow. Do not apply heat to the outside of your mouth. Do not use any products that contain nicotine or tobacco. These   products include cigarettes, chewing tobacco, and vaping devices, such as e-cigarettes. If you need help quitting, ask your dental care provider. Keep all follow-up visits. This is important. How is this prevented?  Excellent dental home care, which includes brushing your teeth every morning and night with fluoride  toothpaste. Floss one time each day. Get regularly scheduled dental cleanings. Consider having a dental sealant applied on teeth that have deep grooves to prevent cavities. Drink fluoridated water regularly. This includes most tap water. Check the label on bottled water to see if it contains fluoride. Reduce or eliminate sugary drinks. Eat healthy meals and snacks. Wear a mouth guard or face shield to protect your teeth while playing sports. Contact a health care provider if: Your pain is worse and is not helped by medicine. You have swelling. You see pus around the tooth. You have a fever or chills. Get help right away if: Your symptoms suddenly get worse. You have a very bad headache. You have problems breathing or swallowing. You have trouble opening your mouth. You have swelling in your neck or around your eye. These symptoms may represent a serious problem that is an emergency. Do not wait to see if the symptoms will go away. Get medical help right away. Call your local emergency services (911 in the [place] Do not drive yourself to the hospital. Summary A dental abscess is a collection of pus in or around a tooth that results from an infection. A dental abscess may result from severe tooth decay, trauma to the tooth, or severe gum disease around a tooth. Symptoms include severe pain, swelling, redness, and drainage of pus in and around the infected tooth. The first priority in treating a dental abscess is to drain out the pus. Treatment may also involve removing damage inside the tooth (root canal) or extracting the tooth. This information is not intended to replace advice given to you by your health care provider. Make sure you discuss any questions you have with your health care provider. Document Revised: 09/20/2020 Document Reviewed: 09/20/2020 Elsevier Patient Education  2023 [employer]  

## 2021-12-30 NOTE — Addendum Note (Signed)
Addended by: Geryl Rankins D on: 12/30/2021 04:31 PM   Modules accepted: Orders

## 2021-12-30 NOTE — Progress Notes (Signed)
Subjective:    Patient ID: Justin Gill, male    DOB: [birth year], 66 y.o.   MRN: [id number]  Chief Complaint  Patient presents with   Medical Management of Chronic Issues   PT presents to the office today for chronic follow up.    He is a current smoker for 46 years and currently smoke 1/2 pack a day. He also drinks two 24 oz of beer a day.    He has COPD and states this is stable. He is currently taking Symbicort BID as needed.    He has ED and takes Viagra as needed. States this was working well, until we increased his BP medications on last visit. He has went back to work and has had increased stress.   He has an abscess tooth on his right lower gum that started bothering him 2- 3 days ago and is worsening. He is followed by a dentists and has a quote to get dental work completed but it is 11K and states he doesn't have that right now. Pain is a 4 out 10. Able to talk and open mouth. No fevers.  Hypertension This is a chronic problem. The current episode started more than 1 year ago. The problem has been resolved since onset. The problem is controlled. Pertinent negatives include no malaise/fatigue, peripheral edema or shortness of breath. Risk factors for coronary artery disease include dyslipidemia, male gender and smoking/tobacco exposure. The current treatment provides moderate improvement.  Nicotine Dependence Presents for follow-up visit. His urge triggers include company of smokers. The symptoms have been stable. He smokes < 1/2 a pack of cigarettes per day.  Rash This is a chronic problem. The current episode started more than 1 year ago. The problem has been waxing and waning since onset. The affected locations include the face. Pertinent negatives include no shortness of breath. Past treatments include topical steroids. The treatment provided moderate relief.     Review of Systems  Constitutional:  Negative for malaise/fatigue.  Respiratory:  Negative for shortness of  breath.   Skin:  Positive for rash.      Objective:   Physical Exam Vitals reviewed.  Constitutional:      General: He is not in acute distress.    Appearance: He is well-developed.  HENT:     Head: Normocephalic.     Right Ear: Tympanic membrane normal.     Left Ear: Tympanic membrane normal.     Mouth/Throat:     Dentition: Dental caries and dental abscesses present.   Eyes:     General:        Right eye: No discharge.        Left eye: No discharge.     Pupils: Pupils are equal, round, and reactive to light.  Neck:     Thyroid: No thyromegaly.  Cardiovascular:     Rate and Rhythm: Normal rate and regular rhythm.     Heart sounds: Normal heart sounds. No murmur heard. Pulmonary:     Effort: Pulmonary effort is normal. No respiratory distress.     Breath sounds: Normal breath sounds. No wheezing.  Abdominal:     General: Bowel sounds are normal. There is no distension.     Palpations: Abdomen is soft.     Tenderness: There is no abdominal tenderness.  Musculoskeletal:        General: No tenderness. Normal range of motion.     Cervical back: Normal range of motion and neck supple.  Skin:  General: Skin is warm and dry.     Findings: No erythema or rash.  Neurological:     Mental Status: He is alert and oriented to person, place, and time.     Cranial Nerves: No cranial nerve deficit.     Deep Tendon Reflexes: Reflexes are normal and symmetric.  Psychiatric:        Behavior: Behavior normal.        Thought Content: Thought content normal.        Judgment: Judgment normal.      BP 114/66   Pulse 76   Temp 98.4 F (36.9 C)   Ht _0  (1.803 m)   Wt 179 lb (81.2 kg)   SpO2 97%   BMI 24.97 kg/m      Assessment & Plan:  [name] comes in today with chief complaint of Medical Management of Chronic Issues   Diagnosis and orders addressed:  1. Primary hypertension - amLODipine (NORVASC) 10 MG tablet; Take 1 tablet (10 mg total) by mouth daily.   Dispense: 90 tablet; Refill: 1 - CMP14+EGFR - CBC with Differential/Platelet  2. COPD, moderate (HCC) - albuterol (VENTOLIN HFA) 108 (90 Base) MCG/ACT inhaler; Inhale 2 puffs into the lungs every 6 (six) hours as needed for wheezing.  Dispense: 1 each; Refill: 2 - budesonide-formoterol (SYMBICORT) 160-4.5 MCG/ACT inhaler; Inhale 2 puffs into the lungs 2 (two) times daily.  Dispense: 1 each; Refill: 3 - CMP14+EGFR - CBC with Differential/Platelet  3. Tobacco abuse - CMP14+EGFR - CBC with Differential/Platelet  4. Erectile dysfunction, unspecified erectile dysfunction type - CMP14+EGFR - CBC with Differential/Platelet  5. Alcohol dependence with unspecified alcohol-induced disorder (HCC) - CMP14+EGFR - CBC with Differential/Platelet  6. Dental abscess - amoxicillin-clavulanate (AUGMENTIN) 875-125 MG tablet; Take 1 tablet by mouth 2 (two) times daily.  Dispense: 20 tablet; Refill: 0 - CMP14+EGFR - CBC with Differential/Platelet  7. Atopic dermatitis, unspecified type - CMP14+EGFR - CBC with Differential/Platelet  8. Drug-induced erectile dysfunction - sildenafil (VIAGRA) 100 MG tablet; TAKE 1 TABLET BY MOUTH AS NEEDED FOR ERECTILE DYSFUNCTION  Dispense: 30 tablet; Refill: 1 - CMP14+EGFR - CBC with Differential/Platelet   Labs pending Health Maintenance reviewed Diet and exercise encouraged  Follow up plan: 6 months   Evelina Dun, FNP

## 2022-01-31 ENCOUNTER — Other Ambulatory Visit: Payer: Self-pay | Admitting: Family

## 2022-01-31 DIAGNOSIS — G8929 Other chronic pain: Secondary | ICD-10-CM

## 2022-01-31 IMAGING — CT CT CHEST LUNG CANCER SCREENING LOW DOSE W/O CM
2 of 3 series · 15 of 36 positions shown, 18 images · non-contrast
Comparison: No priors.

CLINICAL DATA: 65-year-old male current smoker with 40 pack-year
history of smoking. Lung cancer screening examination.

EXAM:
CT CHEST WITHOUT CONTRAST LOW-DOSE FOR LUNG CANCER SCREENING
TECHNIQUE: Multidetector CT imaging of the chest was performed following the
standard protocol without IV contrast.

[Series 2: axial st · axial · 0.67mm/px · z∈[+1173,+1428]mm · 12 of 61 slices shown, 15 images]
[im 5/61  mediastinal]
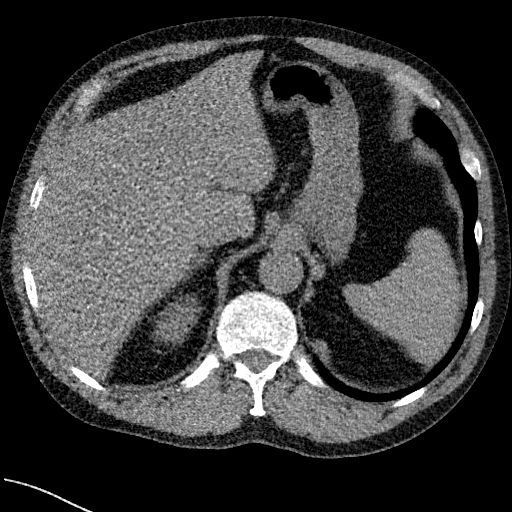
[im 5/61  lung]
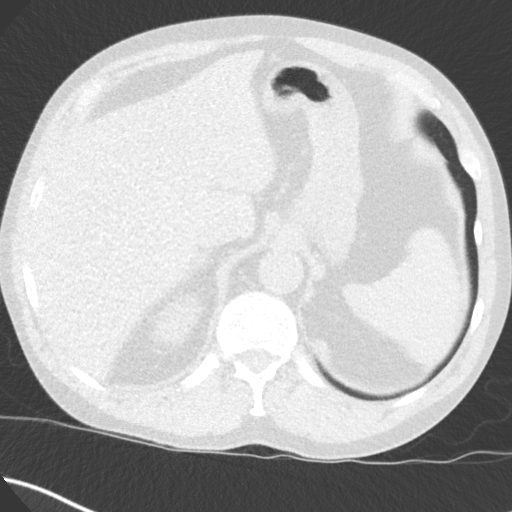
[im 9/61  lung]
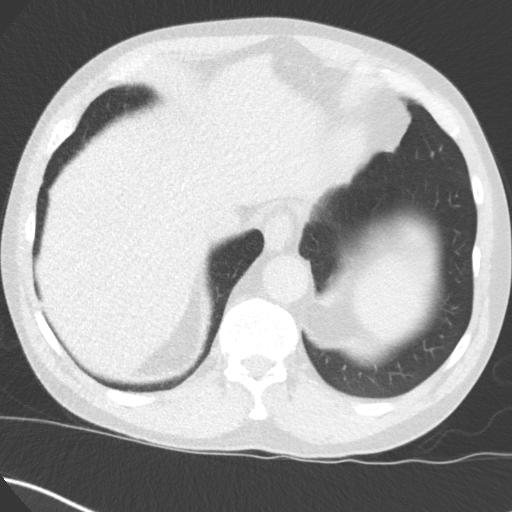
[im 14/61  lung]
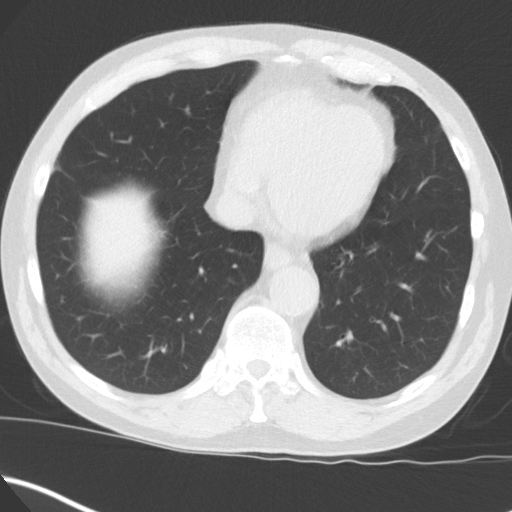
[im 18/61  lung]
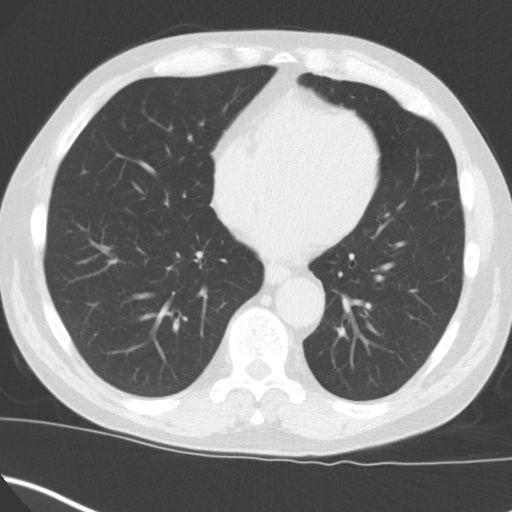
[im 23/61  mediastinal]
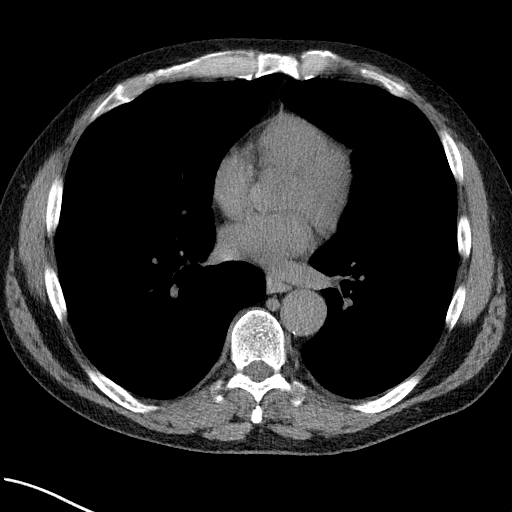
[im 23/61  lung]
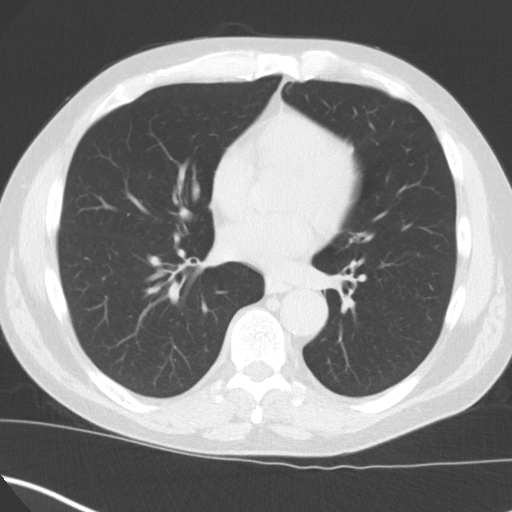
[im 27/61  lung]
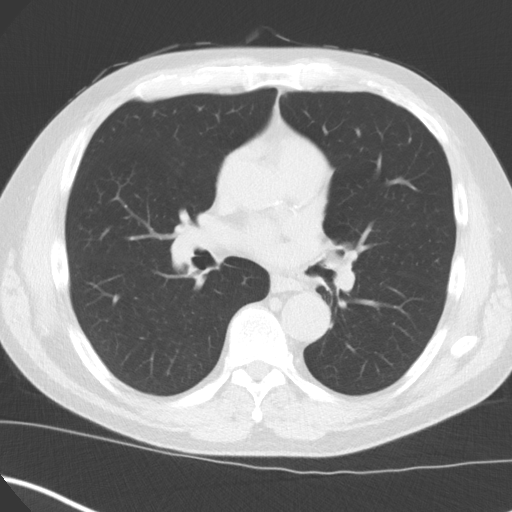
[im 34/61  lung]
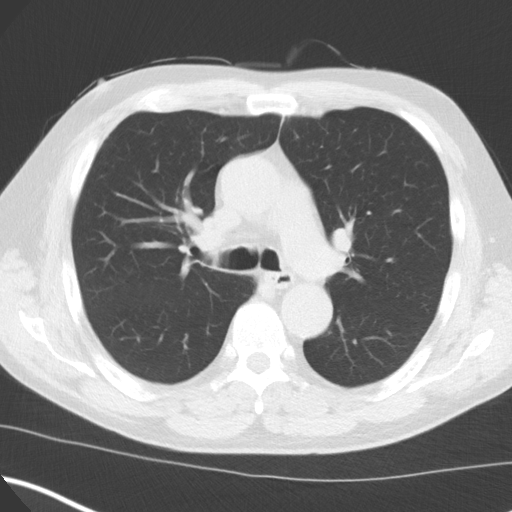
[im 38/61  lung]
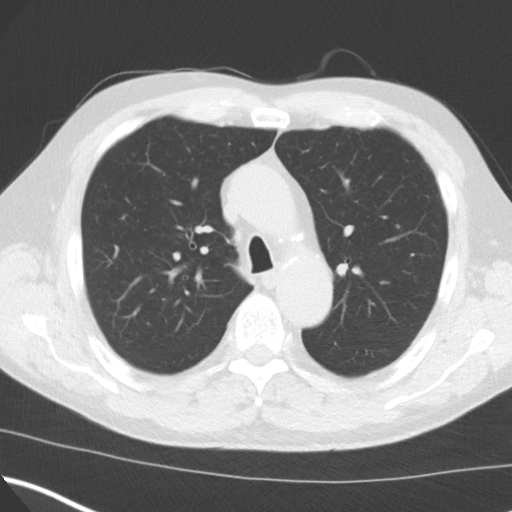
[im 43/61  mediastinal]
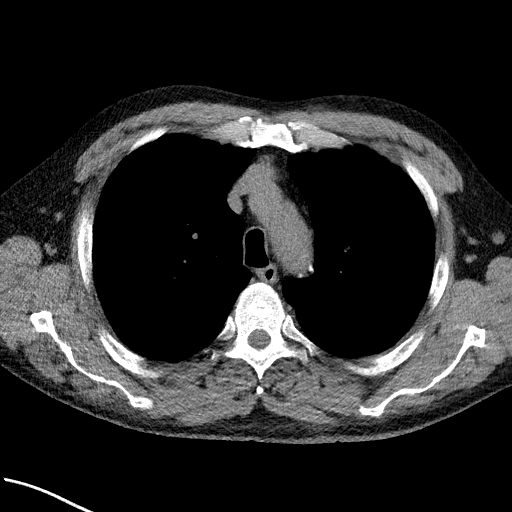
[im 43/61  lung]
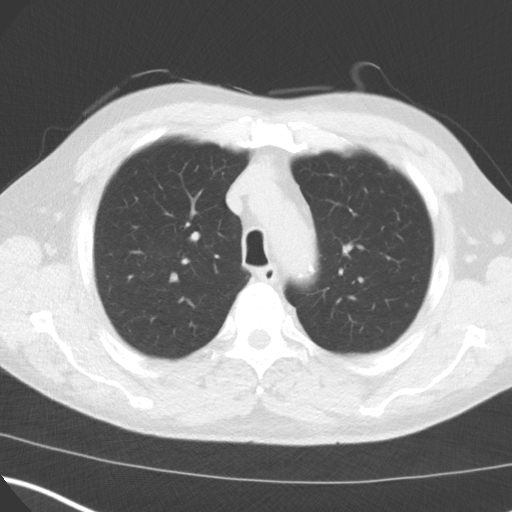
[im 47/61  lung]
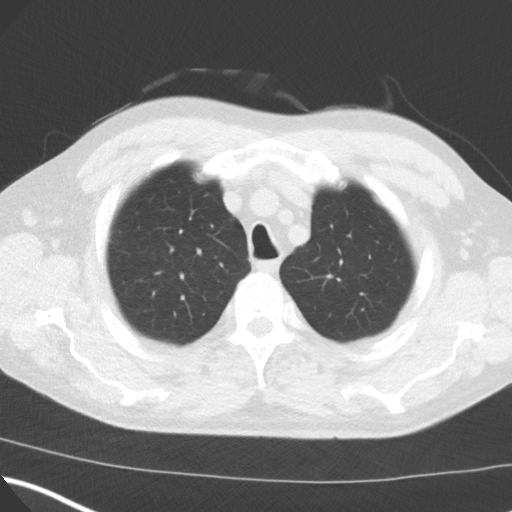
[im 52/61  lung]
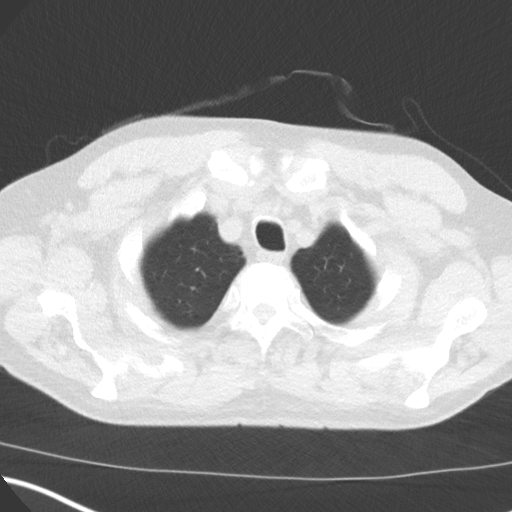
[im 56/61  lung]
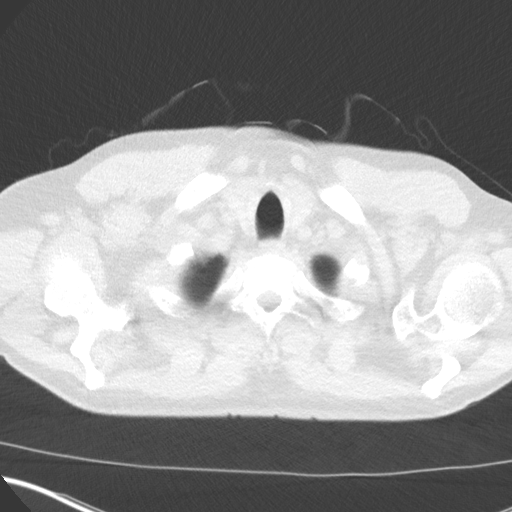

[Series 5: coronal · coronal · 0.62mm/px · 3 of 301 slices shown]
[im 61/301  lung]
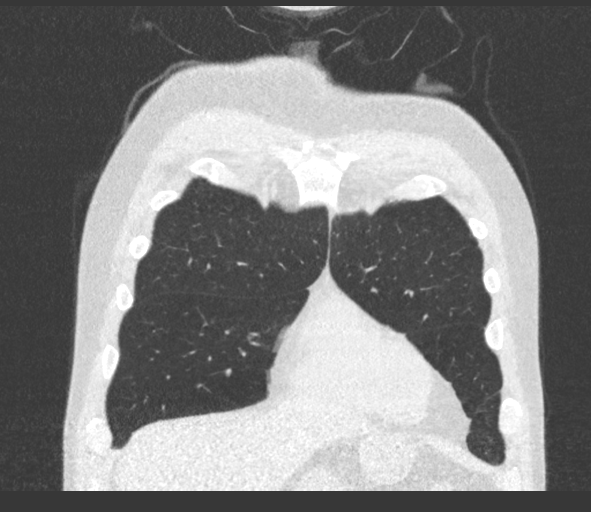
[im 121/301  lung]
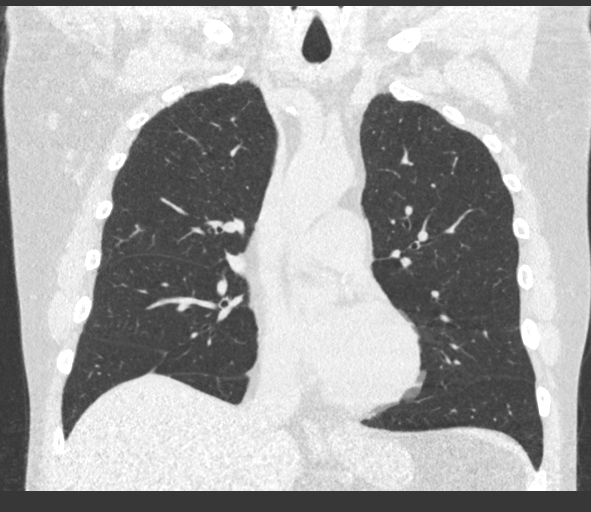
[im 181/301  lung]
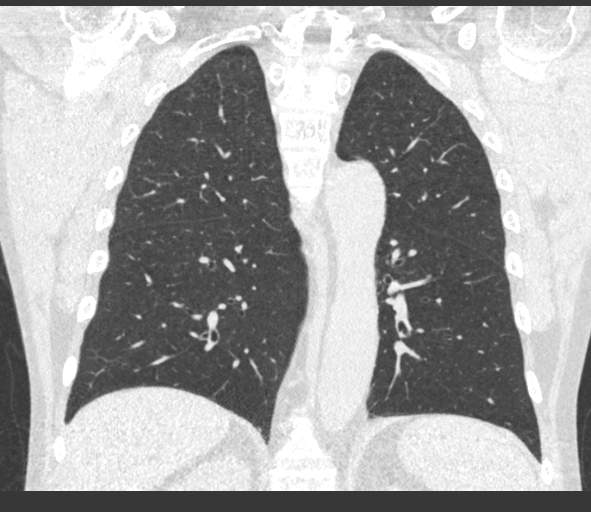

[15 of 36 positions shown; findings below may reference images not displayed]

FINDINGS: Cardiovascular: Heart size is normal. There is no significant
pericardial fluid, thickening or pericardial calcification. There is
aortic atherosclerosis, as well as atherosclerosis of the great
vessels of the mediastinum and the coronary arteries, including
calcified atherosclerotic plaque in the left main and left
circumflex coronary arteries.

Mediastinum/Nodes: No pathologically enlarged mediastinal or hilar
lymph nodes. Please note that accurate exclusion of hilar adenopathy
is limited on noncontrast CT scans. Esophagus is unremarkable in
appearance. No axillary lymphadenopathy.

Lungs/Pleura: No suspicious appearing pulmonary nodules or masses
are noted. No acute consolidative airspace disease. No pleural
effusions. Diffuse bronchial wall thickening with very mild
centrilobular and paraseptal emphysema.

Upper Abdomen: Unremarkable.

Musculoskeletal: Cavernous hemangioma in the T4 vertebral body
incidentally noted. There are no aggressive appearing lytic or
blastic lesions noted in the visualized portions of the skeleton.
IMPRESSION: 1. Lung-RADS 1S, negative. Continue annual screening with low-dose
chest CT without contrast in 12 months.
2. The "S" modifier above refers to potentially clinically
significant non lung cancer related findings. Specifically, there is
aortic atherosclerosis, in addition to left main and left circumflex
coronary artery disease. Please note that although the presence of
coronary artery calcium documents the presence of coronary artery
disease, the severity of this disease and any potential stenosis
cannot be assessed on this non-gated CT examination. Assessment for
potential risk factor modification, dietary therapy or pharmacologic
therapy may be warranted, if clinically indicated.
3. Mild diffuse bronchial wall thickening with very mild
centrilobular and paraseptal emphysema; imaging findings suggestive
of underlying COPD.

Aortic Atherosclerosis (1YICY-EEU.U) and Emphysema (1YICY-MKB.Z).

## 2022-05-01 ENCOUNTER — Ambulatory Visit (INDEPENDENT_AMBULATORY_CARE_PROVIDER_SITE_OTHER): Payer: BC Managed Care – PPO | Admitting: Family Medicine

## 2022-05-01 ENCOUNTER — Encounter: Payer: Self-pay | Admitting: Family Medicine

## 2022-05-01 VITALS — BP 138/76 | HR 85 | Temp 99.9°F | Ht 72.0 in | Wt 176.0 lb

## 2022-05-01 DIAGNOSIS — N2889 Other specified disorders of kidney and ureter: Secondary | ICD-10-CM | POA: Diagnosis not present

## 2022-05-01 DIAGNOSIS — K4090 Unilateral inguinal hernia, without obstruction or gangrene, not specified as recurrent: Secondary | ICD-10-CM | POA: Diagnosis not present

## 2022-05-01 DIAGNOSIS — R1031 Right lower quadrant pain: Secondary | ICD-10-CM | POA: Diagnosis not present

## 2022-05-01 DIAGNOSIS — R509 Fever, unspecified: Secondary | ICD-10-CM

## 2022-05-01 DIAGNOSIS — I700 Atherosclerosis of aorta: Secondary | ICD-10-CM | POA: Diagnosis not present

## 2022-05-01 LAB — URINALYSIS, ROUTINE W REFLEX MICROSCOPIC
Bilirubin, UA: NEGATIVE
Glucose, UA: NEGATIVE
Ketones, UA: NEGATIVE
Leukocytes,UA: NEGATIVE
Nitrite, UA: NEGATIVE
Protein,UA: NEGATIVE
RBC, UA: NEGATIVE
Specific Gravity, UA: 1.015 (ref 1.005–1.030)
Urobilinogen, Ur: 0.2 mg/dL (ref 0.2–1.0)
pH, UA: 8 — ABNORMAL HIGH (ref 5.0–7.5)

## 2022-05-01 NOTE — Progress Notes (Signed)
Subjective:  Patient ID: Justin Gill, male    DOB: 1956-05-01, 66 y.o.   MRN: [id number]  Patient Care Team: Sharion Balloon, FNP as PCP - General (Family Medicine) Gala Romney Cristopher Estimable, MD as Consulting Physician (Gastroenterology)   Chief Complaint:  pain right side of groin (Feels bump)   HPI: Justin Gill is a 66 y.o. male presenting on 05/01/2022 for pain right side of groin (Feels bump)   Pt presents today for evaluation of RLQ pain and swelling. States he has been wearing his belt tight for the last several weeks and this has caused the pain and swelling. Denies injury. Does lift barrels at work. Denies other heavy lifting or strenuous activity. He noticed swelling to his RLQ about 1 week ago with some discomfort. States he had some antibiotics at home so he started taking them and felt better. States now the swelling is still present. Denies bowel or bladder habit changes. No penile discharge or swelling. No scrotal pain or swelling. No hematuria or dysuria. States he was unaware of having a fever until today.      Relevant past medical, surgical, family, and social history reviewed and updated as indicated.  Allergies and medications reviewed and updated. Data reviewed: Chart in Epic.   Past Medical History:  Diagnosis Date   Arthritis    Asthma    Hypertension     Past Surgical History:  Procedure Laterality Date   COLONOSCOPY WITH PROPOFOL N/A 11/29/2020   Procedure: COLONOSCOPY WITH PROPOFOL;  Surgeon: Daneil Dolin, MD;  [facility];  Service: Endoscopy;  Laterality: N/A;  AM   ORTHOPEDIC SURGERY     PERCUTANEOUS PINNING Bilateral 09/22/2012   Procedure: Bilateral hands/I&D and Repair as Needed;  Surgeon: Linna Hoff, MD;  Location: [place];  Service: Orthopedics;  Laterality: Bilateral;   POLYPECTOMY  11/29/2020   Procedure: POLYPECTOMY;  Surgeon: Daneil Dolin, MD;  [facility]: [facility];  Service: Endoscopy;;   TONSILLECTOMY      Social History    Socioeconomic History   Marital status: Divorced    Spouse name: Not on file   Number of children: Not on file   Years of education: Not on file   Highest education level: Not on file  Occupational History   Not on file  Tobacco Use   Smoking status: Every Day    Packs/day: 0.50    Years: 20.00    Total pack years: 10.00    Types: Cigarettes   Smokeless tobacco: Never  Vaping Use   Vaping Use: Never used  Substance and Sexual Activity   Alcohol use: Yes    Comment: 40 ounces per day   Drug use: No   Sexual activity: Not on file  Other Topics Concern   Not on file  Social History Narrative   Not on file   Social Determinants of Health   Financial Resource Strain: Medium Risk (12/18/2020)   Overall Financial Resource Strain (CARDIA)    Difficulty of Paying Living Expenses: Somewhat hard  Food Insecurity: No Food Insecurity (12/18/2020)   Hunger Vital Sign    Worried About Running Out of Food in the Last Year: Never true    Ran Out of Food in the Last Year: Never true  Transportation Needs: No Transportation Needs (09/20/2020)   PRAPARE - Hydrologist (Medical): No    Lack of Transportation (Non-Medical): No  Physical Activity: Insufficiently Active (07/25/2021)  Exercise Vital Sign    Days of Exercise per Week: 1 day    Minutes of Exercise per Session: 10 min  Stress: Stress Concern Present (07/25/2021)   [place]    Feeling of Stress : To some extent  Social Connections: Moderately Isolated (09/20/2020)   Social Connection and Isolation Panel [NHANES]    Frequency of Communication with Friends and Family: More than three times a week    Frequency of Social Gatherings with Friends and Family: More than three times a week    Attends Religious Services: More than 4 times per year    Active Member of [employer] or Organizations: No    Attends Archivist Meetings: Never     Marital Status: Divorced  Human resources officer Violence: Not At Risk (12/18/2020)   Humiliation, Afraid, Rape, and Kick questionnaire    Fear of Current or Ex-Partner: No    Emotionally Abused: No    Physically Abused: No    Sexually Abused: No    Outpatient Encounter Medications as of 05/01/2022  Medication Sig   albuterol (VENTOLIN HFA) 108 (90 Base) MCG/ACT inhaler Inhale 2 puffs into the lungs every 6 (six) hours as needed for wheezing.   amLODipine (NORVASC) 10 MG tablet Take 1 tablet (10 mg total) by mouth daily.   amoxicillin-clavulanate (AUGMENTIN) 875-125 MG tablet Take 1 tablet by mouth 2 (two) times daily.   budesonide-formoterol (SYMBICORT) 160-4.5 MCG/ACT inhaler Inhale 2 puffs into the lungs 2 (two) times daily.   Cyanocobalamin (B-12 PO) Take 1 tablet by mouth daily.   diphenhydrAMINE (BENADRYL) 25 MG tablet Take 25 mg by mouth every 6 (six) hours as needed.   ibuprofen (ADVIL) 800 MG tablet TAKE 1 TABLET BY MOUTH EVERY 8 HOURS AS NEEDED   Multiple Vitamin (MULTIVITAMIN WITH MINERALS) TABS Take 1 tablet by mouth daily.   pyridOXINE (VITAMIN B-6) 100 MG tablet Take 100 mg by mouth daily.   sildenafil (VIAGRA) 100 MG tablet TAKE 1 TABLET BY MOUTH AS NEEDED FOR ERECTILE DYSFUNCTION   triamcinolone cream (KENALOG) 0.1 % Apply 1 application. topically daily as needed (Rash).   No facility-administered encounter medications on file as of 05/01/2022.    Allergies  Allergen Reactions   Bee Venom Hives and Swelling    Review of Systems  Constitutional:  Positive for fever. Negative for activity change, appetite change, chills, diaphoresis, fatigue and unexpected weight change.  HENT: Negative.    Eyes: Negative.   Respiratory:  Negative for cough, chest tightness and shortness of breath.   Cardiovascular:  Negative for chest pain, palpitations and leg swelling.  Gastrointestinal:  Positive for abdominal pain. Negative for abdominal distention, anal bleeding, blood in stool,  constipation, diarrhea, nausea, rectal pain and vomiting.  Endocrine: Negative.   Genitourinary:  Negative for decreased urine volume, difficulty urinating, dysuria, enuresis, flank pain, frequency, genital sores, hematuria, penile discharge, penile pain, penile swelling, scrotal swelling, testicular pain and urgency.  Musculoskeletal:  Negative for arthralgias and myalgias.  Skin: Negative.   Allergic/Immunologic: Negative.   Neurological:  Negative for dizziness, tremors, seizures, syncope, facial asymmetry, speech difficulty, weakness, light-headedness, numbness and headaches.  Hematological: Negative.   Psychiatric/Behavioral:  Negative for confusion, hallucinations, sleep disturbance and suicidal ideas.   All other systems reviewed and are negative.       Objective:  BP 138/76   Pulse 85   Temp 99.9 F (37.7 C)   Ht 6' (1.829 m)   Wt 176  lb (79.8 kg)   SpO2 96%   BMI 23.87 kg/m    Wt Readings from Last 3 Encounters:  05/01/22 176 lb (79.8 kg)  12/30/21 179 lb (81.2 kg)  07/01/21 183 lb 6.4 oz (83.2 kg)    Physical Exam Vitals and nursing note reviewed.  Constitutional:      General: He is not in acute distress.    Appearance: Normal appearance. He is well-developed, well-groomed and normal weight. He is not ill-appearing, toxic-appearing or diaphoretic.  HENT:     Head: Normocephalic and atraumatic.     Jaw: There is normal jaw occlusion.     Right Ear: Hearing normal.     Left Ear: Hearing normal.     Nose: Nose normal.     Mouth/Throat:     Lips: Pink.     Mouth: Mucous membranes are moist.     Pharynx: Oropharynx is clear. Uvula midline.  Eyes:     General: Lids are normal.     Extraocular Movements: Extraocular movements intact.     Conjunctiva/sclera: Conjunctivae normal.     Pupils: Pupils are equal, round, and reactive to light.  Neck:     Thyroid: No thyroid mass, thyromegaly or thyroid tenderness.     Vascular: No carotid bruit or JVD.     Trachea:  Trachea and phonation normal.  Cardiovascular:     Rate and Rhythm: Normal rate and regular rhythm.     Chest Wall: PMI is not displaced.     Pulses: Normal pulses.     Heart sounds: Normal heart sounds. No murmur heard.    No friction rub. No gallop.  Pulmonary:     Effort: Pulmonary effort is normal. No respiratory distress.     Breath sounds: Normal breath sounds. No wheezing.  Abdominal:     General: Bowel sounds are normal. There is no distension or abdominal bruit.     Palpations: Abdomen is soft. There is no hepatomegaly or splenomegaly.     Tenderness: There is no abdominal tenderness. There is no right CVA tenderness, left CVA tenderness, guarding or rebound. Negative signs include Murphy's sign, Rovsing's sign, McBurney's sign, psoas sign and obturator sign.     Hernia: A hernia is present. Hernia is present in the right inguinal area.    Musculoskeletal:        General: Normal range of motion.     Cervical back: Normal range of motion and neck supple.     Right lower leg: No edema.     Left lower leg: No edema.  Lymphadenopathy:     Cervical: No cervical adenopathy.  Skin:    General: Skin is warm and dry.     Capillary Refill: Capillary refill takes less than 2 seconds.     Coloration: Skin is not cyanotic, jaundiced or pale.     Findings: No rash.  Neurological:     General: No focal deficit present.     Mental Status: He is alert and oriented to person, place, and time.     Sensory: Sensation is intact.     Motor: Motor function is intact.     Coordination: Coordination is intact.     Gait: Gait is intact.     Deep Tendon Reflexes: Reflexes are normal and symmetric.  Psychiatric:        Attention and Perception: Attention and perception normal.        Mood and Affect: Mood and affect normal.  Speech: Speech normal.        Behavior: Behavior normal. Behavior is cooperative.        Thought Content: Thought content normal.        Cognition and Memory:  Cognition and memory normal.        Judgment: Judgment normal.     Results for orders placed or performed in visit on 07/01/21  CBC with Differential/Platelet  Result Value Ref Range   WBC 4.1 3.4 - 10.8 x10E3/uL   RBC 5.38 4.14 - 5.80 x10E6/uL   Hemoglobin 17.2 13.0 - 17.7 g/dL   Hematocrit 49.4 37.5 - 51.0 %   MCV 92 79 - 97 fL   MCH 32.0 26.6 - 33.0 pg   MCHC 34.8 31.5 - 35.7 g/dL   RDW 11.3 (L) 11.6 - 15.4 %   Platelets 286 150 - 450 x10E3/uL   Neutrophils 38 Not Estab. %   Lymphs 39 Not Estab. %   Monocytes 13 Not Estab. %   Eos 8 Not Estab. %   Basos 1 Not Estab. %   Neutrophils Absolute 1.5 1.4 - 7.0 x10E3/uL   Lymphocytes Absolute 1.6 0.7 - 3.1 x10E3/uL   Monocytes Absolute 0.5 0.1 - 0.9 x10E3/uL   EOS (ABSOLUTE) 0.3 0.0 - 0.4 x10E3/uL   Basophils Absolute 0.1 0.0 - 0.2 x10E3/uL   Immature Granulocytes 1 Not Estab. %   Immature Grans (Abs) 0.0 0.0 - 0.1 x10E3/uL  BMP8+EGFR  Result Value Ref Range   Glucose 106 (H) 70 - 99 mg/dL   BUN 7 (L) 8 - 27 mg/dL   Creatinine, Ser 0.80 0.76 - 1.27 mg/dL   eGFR 98 >59 mL/min/1.73   BUN/Creatinine Ratio 9 (L) 10 - 24   Sodium 138 134 - 144 mmol/L   Potassium 4.1 3.5 - 5.2 mmol/L   Chloride 102 96 - 106 mmol/L   CO2 24 20 - 29 mmol/L   Calcium 9.2 8.6 - 10.2 mg/dL  Hepatic function panel  Result Value Ref Range   Total Protein 6.5 6.0 - 8.5 g/dL   Albumin 4.0 3.8 - 4.8 g/dL   Bilirubin Total 0.6 0.0 - 1.2 mg/dL   Bilirubin, Direct 0.15 0.00 - 0.40 mg/dL   Alkaline Phosphatase 54 44 - 121 IU/L   AST 19 0 - 40 IU/L   ALT 17 0 - 44 IU/L       Pertinent labs & imaging results that were available during my care of the patient were reviewed by me and considered in my medical decision making.  Assessment & Plan:  Justin Gill was seen today for pain right side of groin.  Diagnoses and all orders for this visit:  Right lower quadrant abdominal pain Fever and chills Urinalysis unremarkable in office. Labs and CT pending.  Inguinal hernia on exam, easily reducible and nontender. Referral to general surgery placed. Further treatment pending results.  -     Urinalysis, Routine w reflex microscopic -     CBC with Differential/Platelet -     CMP14+EGFR -     CT Abdomen Pelvis W Contrast; Future  Right inguinal hernia Noted on exam, soft and easily reducible. No indications of strangulation, incarceration, or gangrene. Referral to general surgery placed.  -     Ambulatory referral to General Surgery     Continue all other maintenance medications.  Follow up plan: Return if symptoms worsen or fail to improve.   Continue healthy lifestyle choices, including diet (rich in fruits, vegetables, and lean proteins,  and low in salt and simple carbohydrates) and exercise (at least 30 minutes of moderate physical activity daily).   The above assessment and management plan was discussed with the patient. The patient verbalized understanding of and has agreed to the management plan. Patient is aware to call the clinic if they develop any new symptoms or if symptoms persist or worsen. Patient is aware when to return to the clinic for a follow-up visit. Patient educated on when it is appropriate to go to the emergency department.   Monia Pouch, FNP-C [facility] [phone]

## 2022-05-02 LAB — CBC WITH DIFFERENTIAL/PLATELET
Basophils Absolute: 0.1 10*3/uL (ref 0.0–0.2)
Basos: 2 %
EOS (ABSOLUTE): 0.1 10*3/uL (ref 0.0–0.4)
Eos: 4 %
Hematocrit: 43.6 % (ref 37.5–51.0)
Hemoglobin: 15.2 g/dL (ref 13.0–17.7)
Immature Grans (Abs): 0 10*3/uL (ref 0.0–0.1)
Immature Granulocytes: 1 %
Lymphocytes Absolute: 0.8 10*3/uL (ref 0.7–3.1)
Lymphs: 25 %
MCH: 32 pg (ref 26.6–33.0)
MCHC: 34.9 g/dL (ref 31.5–35.7)
MCV: 92 fL (ref 79–97)
Monocytes Absolute: 0.8 10*3/uL (ref 0.1–0.9)
Monocytes: 25 %
Neutrophils Absolute: 1.4 10*3/uL (ref 1.4–7.0)
Neutrophils: 43 %
Platelets: 242 10*3/uL (ref 150–450)
RBC: 4.75 x10E6/uL (ref 4.14–5.80)
RDW: 12.6 % (ref 11.6–15.4)
WBC: 3.2 10*3/uL — ABNORMAL LOW (ref 3.4–10.8)

## 2022-05-02 LAB — CMP14+EGFR
ALT: 31 IU/L (ref 0–44)
AST: 36 IU/L (ref 0–40)
Albumin/Globulin Ratio: 1.7 (ref 1.2–2.2)
Albumin: 4.5 g/dL (ref 3.9–4.9)
Alkaline Phosphatase: 70 IU/L (ref 44–121)
BUN/Creatinine Ratio: 9 — ABNORMAL LOW (ref 10–24)
BUN: 9 mg/dL (ref 8–27)
Bilirubin Total: 0.8 mg/dL (ref 0.0–1.2)
CO2: 23 mmol/L (ref 20–29)
Calcium: 9.3 mg/dL (ref 8.6–10.2)
Chloride: 99 mmol/L (ref 96–106)
Creatinine, Ser: 1.02 mg/dL (ref 0.76–1.27)
Globulin, Total: 2.6 g/dL (ref 1.5–4.5)
Glucose: 84 mg/dL (ref 70–99)
Potassium: 4.5 mmol/L (ref 3.5–5.2)
Sodium: 136 mmol/L (ref 134–144)
Total Protein: 7.1 g/dL (ref 6.0–8.5)
eGFR: 81 mL/min/{1.73_m2} (ref >59)

## 2022-05-05 ENCOUNTER — Ambulatory Visit (HOSPITAL_COMMUNITY)
Admission: RE | Admit: 2022-05-05 | Discharge: 2022-05-05 | Disposition: A | Discharge status: Home / Self Care | Payer: BC Managed Care – PPO | Source: Ambulatory Visit | Attending: Family Medicine | Admitting: Family Medicine

## 2022-05-05 DIAGNOSIS — R509 Fever, unspecified: Secondary | ICD-10-CM | POA: Insufficient documentation

## 2022-05-05 DIAGNOSIS — I700 Atherosclerosis of aorta: Secondary | ICD-10-CM | POA: Diagnosis not present

## 2022-05-05 DIAGNOSIS — R1031 Right lower quadrant pain: Secondary | ICD-10-CM | POA: Insufficient documentation

## 2022-05-05 MED ORDER — IOHEXOL 300 MG/ML  SOLN
100.0000 mL | Freq: Once | INTRAMUSCULAR | Status: AC | PRN
  Administered 2022-05-05: 100 mL via INTRAVENOUS

## 2022-05-05 NOTE — Addendum Note (Signed)
Addended by: Baruch Gouty on: 05/05/2022 12:16 PM   Modules accepted: Orders

## 2022-05-09 DIAGNOSIS — N2889 Other specified disorders of kidney and ureter: Secondary | ICD-10-CM | POA: Insufficient documentation

## 2022-05-09 DIAGNOSIS — I700 Atherosclerosis of aorta: Secondary | ICD-10-CM | POA: Insufficient documentation

## 2022-05-09 MED ORDER — ATORVASTATIN CALCIUM 20 MG PO TABS: 20.0000 mg | ORAL_TABLET | Freq: Every day | ORAL | 3 refills | Status: DC

## 2022-05-09 NOTE — Addendum Note (Signed)
Addended by: Baruch Gouty on: 05/09/2022 02:20 PM   Modules accepted: Orders

## 2022-05-10 ENCOUNTER — Other Ambulatory Visit: Payer: Self-pay | Admitting: Family

## 2022-05-10 DIAGNOSIS — N522 Drug-induced erectile dysfunction: Secondary | ICD-10-CM

## 2022-05-15 ENCOUNTER — Ambulatory Visit (INDEPENDENT_AMBULATORY_CARE_PROVIDER_SITE_OTHER): Payer: BC Managed Care – PPO | Admitting: Surgery

## 2022-05-15 VITALS — BP 164/84 | HR 75 | Temp 97.7°F | Resp 12 | Ht 72.0 in | Wt 175.0 lb

## 2022-05-15 DIAGNOSIS — K4090 Unilateral inguinal hernia, without obstruction or gangrene, not specified as recurrent: Secondary | ICD-10-CM

## 2022-05-15 NOTE — Progress Notes (Signed)
[facility] History and Physical  Reason for Referral: Right inguinal hernia Referring Physician: Darla Lesches, FNP  Chief Complaint   New Patient (Initial Visit)     Justin Gill is a 66 y.o. male.  HPI: Patient presents for evaluation of a right inguinal hernia.  He first noticed a right groin bulge about 3 weeks ago.  He started having pain at the site.  Initially was a sharp pain, and now he describes the pain as a hot/cold sensation with occasional tingling.  His pain is worse with standing.  He has never had anything like this in the past.  He is tolerating a diet without nausea and vomiting and moving his bowels without issue.  His surgical history significant for tonsillectomy.  He denies any history of abdominal surgeries.  He has a past medical history significant for hypertension, allergies, and joint pain.  He is currently taking an 81 mg aspirin daily.  He smokes 1 pack of cigarettes every 3 days and drinks 40 ounces of beer per day.  He denies ever having withdrawal symptoms.  He denies use of illicit drugs.  Past Medical History:  Diagnosis Date   Arthritis    Asthma    Hypertension     Past Surgical History:  Procedure Laterality Date   COLONOSCOPY WITH PROPOFOL N/A 11/29/2020   Procedure: COLONOSCOPY WITH PROPOFOL;  Surgeon: Daneil Dolin, MD;  [facility];  Service: Endoscopy;  Laterality: N/A;  AM   ORTHOPEDIC SURGERY     PERCUTANEOUS PINNING Bilateral 09/22/2012   Procedure: Bilateral hands/I&D and Repair as Needed;  Surgeon: Linna Hoff, MD;  Location: [place];  Service: Orthopedics;  Laterality: Bilateral;   POLYPECTOMY  11/29/2020   Procedure: POLYPECTOMY;  Surgeon: Daneil Dolin, MD;  [facility];  Service: Endoscopy;;   TONSILLECTOMY      Family History  Problem Relation Age of Onset   Diabetes Mother    Heart disease Mother    Hyperlipidemia Mother    Hypertension Mother    Colon cancer Neg Hx    Colon polyps  Neg Hx     Social History   Tobacco Use   Smoking status: Every Day    Packs/day: 0.50    Years: 20.00    Total pack years: 10.00    Types: Cigarettes   Smokeless tobacco: Never  Vaping Use   Vaping Use: Never used  Substance Use Topics   Alcohol use: Yes    Comment: 40 ounces per day   Drug use: No    Medications: I have reviewed the patient's current medications. Allergies as of 05/15/2022       Reactions   Bee Venom Hives, Swelling        Medication List        Accurate as of May 15, 2022  9:30 AM. If you have any questions, ask your nurse or doctor.          albuterol 108 (90 Base) MCG/ACT inhaler Commonly known as: VENTOLIN HFA Inhale 2 puffs into the lungs every 6 (six) hours as needed for wheezing.   amLODipine 10 MG tablet Commonly known as: NORVASC Take 1 tablet (10 mg total) by mouth daily.   amoxicillin-clavulanate 875-125 MG tablet Commonly known as: AUGMENTIN Take 1 tablet by mouth 2 (two) times daily.   atorvastatin 20 MG tablet Commonly known as: LIPITOR Take 1 tablet (20 mg total) by mouth daily.   B-12 PO Take 1 tablet  by mouth daily.   budesonide-formoterol 160-4.5 MCG/ACT inhaler Commonly known as: SYMBICORT Inhale 2 puffs into the lungs 2 (two) times daily.   diphenhydrAMINE 25 MG tablet Commonly known as: BENADRYL Take 25 mg by mouth every 6 (six) hours as needed.   ibuprofen 800 MG tablet Commonly known as: ADVIL TAKE 1 TABLET BY MOUTH EVERY 8 HOURS AS NEEDED   multivitamin with minerals Tabs tablet Take 1 tablet by mouth daily.   pyridOXINE 100 MG tablet Commonly known as: VITAMIN B6 Take 100 mg by mouth daily.   sildenafil 100 MG tablet Commonly known as: VIAGRA TAKE 1 TABLET BY MOUTH AS NEEDED FOR ERECTILE DYSFUNCTION   triamcinolone cream 0.1 % Commonly known as: KENALOG Apply 1 application. topically daily as needed (Rash).         ROS:  Constitutional: negative for chills, fatigue, and  fevers Eyes: negative for visual disturbance and pain Ears, nose, mouth, throat, and face: positive for sinus problems, negative for ear drainage Respiratory: positive for wheezing, negative for cough and shortness of breath Cardiovascular: negative for chest pain Gastrointestinal: negative for abdominal pain, nausea, reflux symptoms, and vomiting Genitourinary:negative for dysuria, frequency, and urinary retention Integument/breast: negative for dryness and rash Hematologic/lymphatic: negative for bleeding and lymphadenopathy Musculoskeletal:positive for joint pain, negative for back pain and neck pain Neurological: negative for dizziness, tremors, and numbness Endocrine: negative for temperature intolerance  Blood pressure (!) 164/84, pulse 75, temperature 97.7 F (36.5 C), temperature source Other (Comment), resp. rate 12, height 6' (1.829 m), weight 175 lb (79.4 kg), SpO2 95 %. Physical Exam Vitals reviewed.  Constitutional:      Appearance: Normal appearance.  HENT:     Head: Normocephalic and atraumatic.  Eyes:     Extraocular Movements: Extraocular movements intact.     Pupils: Pupils are equal, round, and reactive to light.  Cardiovascular:     Rate and Rhythm: Normal rate and regular rhythm.  Pulmonary:     Effort: Pulmonary effort is normal.     Breath sounds: Normal breath sounds.  Abdominal:     Comments: Abdomen soft, nondistended, no percussion tenderness, nontender to palpation; no rigidity, guarding, rebound tenderness; soft and reducible right inguinal hernia, nontender  Musculoskeletal:        General: Normal range of motion.     Cervical back: Normal range of motion.  Skin:    General: Skin is warm and dry.  Neurological:     General: No focal deficit present.     Mental Status: He is alert and oriented to person, place, and time.  Psychiatric:        Mood and Affect: Mood normal.        Behavior: Behavior normal.     Results: CT abdomen pelvis  (05/05/2022): IMPRESSION: 1. No acute findings in the abdomen or pelvis. Specifically, no findings to explain the patient's history of right lower quadrant pain. The appendix and terminal ileum are normal. 2. 13 mm subcapsular lesion in the lower pole right kidney may have some low level internal enhancement. Follow-up MRI with and without contrast recommended to further evaluate. 3. Small focus of collapse/consolidation in the left lower lobe. Pneumonia not excluded. 4. Tiny hiatal hernia. 5. Small bilateral groin and umbilical hernias contain only fat. 6. Aortic Atherosclerosis (ICD10-I70.0).   Assessment & Plan:  Justin Gill is a 66 y.o. male who presents for evaluation of right inguinal hernia  -I discussed the pathophysiology of inguinal hernias, and why we recommend surgical repair. -  The risk and benefits of robotic right inguinal hernia repair with mesh, possible bilateral were discussed including but not limited to bleeding, infection, injury to surrounding structures, need for additional procedures, and hernia recurrence.  After careful consideration, Justin Gill has decided to proceed with robotic hernia repair. -Information provided to the patient regarding inguinal hernias -Patient tentatively scheduled for surgery on 11/16 -Advised the patient to present to the emergency department if he begins to have a nonreducible tender bulge, nausea, vomiting, and obstipation  All questions were answered to the satisfaction of the patient.   Graciella Freer, DO [facility] [address] [place], [place] [id number] [phone] (office)

## 2022-05-16 NOTE — H&P (Signed)
[facility] History and Physical  Reason for Referral: Right inguinal hernia Referring Physician: Darla Lesches, FNP  Chief Complaint   New Patient (Initial Visit)     Justin Gill is a 66 y.o. male.  HPI: Patient presents for evaluation of a right inguinal hernia.  He first noticed a right groin bulge about 3 weeks ago.  He started having pain at the site.  Initially was a sharp pain, and now he describes the pain as a hot/cold sensation with occasional tingling.  His pain is worse with standing.  He has never had anything like this in the past.  He is tolerating a diet without nausea and vomiting and moving his bowels without issue.  His surgical history significant for tonsillectomy.  He denies any history of abdominal surgeries.  He has a past medical history significant for hypertension, allergies, and joint pain.  He is currently taking an 81 mg aspirin daily.  He smokes 1 pack of cigarettes every 3 days and drinks 40 ounces of beer per day.  He denies ever having withdrawal symptoms.  He denies use of illicit drugs.  Past Medical History:  Diagnosis Date   Arthritis    Asthma    Hypertension     Past Surgical History:  Procedure Laterality Date   COLONOSCOPY WITH PROPOFOL N/A 11/29/2020   Procedure: COLONOSCOPY WITH PROPOFOL;  Surgeon: Daneil Dolin, MD;  Location: [facility];  Service: Endoscopy;  Laterality: N/A;  AM   ORTHOPEDIC SURGERY     PERCUTANEOUS PINNING Bilateral 09/22/2012   Procedure: Bilateral hands/I&D and Repair as Needed;  Surgeon: Linna Hoff, MD;  Location: [place];  Service: Orthopedics;  Laterality: Bilateral;   POLYPECTOMY  11/29/2020   Procedure: POLYPECTOMY;  Surgeon: Daneil Dolin, MD;  Location: [facility];  Service: Endoscopy;;   TONSILLECTOMY      Family History  Problem Relation Age of Onset   Diabetes Mother    Heart disease Mother    Hyperlipidemia Mother    Hypertension Mother    Colon cancer Neg Hx    Colon polyps  Neg Hx     Social History   Tobacco Use   Smoking status: Every Day    Packs/day: 0.50    Years: 20.00    Total pack years: 10.00    Types: Cigarettes   Smokeless tobacco: Never  Vaping Use   Vaping Use: Never used  Substance Use Topics   Alcohol use: Yes    Comment: 40 ounces per day   Drug use: No    Medications: I have reviewed the patient's current medications. Allergies as of 05/15/2022       Reactions   Bee Venom Hives, Swelling        Medication List        Accurate as of May 15, 2022  9:30 AM. If you have any questions, ask your nurse or doctor.          albuterol 108 (90 Base) MCG/ACT inhaler Commonly known as: VENTOLIN HFA Inhale 2 puffs into the lungs every 6 (six) hours as needed for wheezing.   amLODipine 10 MG tablet Commonly known as: NORVASC Take 1 tablet (10 mg total) by mouth daily.   amoxicillin-clavulanate 875-125 MG tablet Commonly known as: AUGMENTIN Take 1 tablet by mouth 2 (two) times daily.   atorvastatin 20 MG tablet Commonly known as: LIPITOR Take 1 tablet (20 mg total) by mouth daily.   B-12 PO Take 1 tablet  by mouth daily.   budesonide-formoterol 160-4.5 MCG/ACT inhaler Commonly known as: SYMBICORT Inhale 2 puffs into the lungs 2 (two) times daily.   diphenhydrAMINE 25 MG tablet Commonly known as: BENADRYL Take 25 mg by mouth every 6 (six) hours as needed.   ibuprofen 800 MG tablet Commonly known as: ADVIL TAKE 1 TABLET BY MOUTH EVERY 8 HOURS AS NEEDED   multivitamin with minerals Tabs tablet Take 1 tablet by mouth daily.   pyridOXINE 100 MG tablet Commonly known as: VITAMIN B6 Take 100 mg by mouth daily.   sildenafil 100 MG tablet Commonly known as: VIAGRA TAKE 1 TABLET BY MOUTH AS NEEDED FOR ERECTILE DYSFUNCTION   triamcinolone cream 0.1 % Commonly known as: KENALOG Apply 1 application. topically daily as needed (Rash).         ROS:  Constitutional: negative for chills, fatigue, and  fevers Eyes: negative for visual disturbance and pain Ears, nose, mouth, throat, and face: positive for sinus problems, negative for ear drainage Respiratory: positive for wheezing, negative for cough and shortness of breath Cardiovascular: negative for chest pain Gastrointestinal: negative for abdominal pain, nausea, reflux symptoms, and vomiting Genitourinary:negative for dysuria, frequency, and urinary retention Integument/breast: negative for dryness and rash Hematologic/lymphatic: negative for bleeding and lymphadenopathy Musculoskeletal:positive for joint pain, negative for back pain and neck pain Neurological: negative for dizziness, tremors, and numbness Endocrine: negative for temperature intolerance  Blood pressure (!) 164/84, pulse 75, temperature 97.7 F (36.5 C), temperature source Other (Comment), resp. rate 12, height 6' (1.829 m), weight 175 lb (79.4 kg), SpO2 95 %. Physical Exam Vitals reviewed.  Constitutional:      Appearance: Normal appearance.  HENT:     Head: Normocephalic and atraumatic.  Eyes:     Extraocular Movements: Extraocular movements intact.     Pupils: Pupils are equal, round, and reactive to light.  Cardiovascular:     Rate and Rhythm: Normal rate and regular rhythm.  Pulmonary:     Effort: Pulmonary effort is normal.     Breath sounds: Normal breath sounds.  Abdominal:     Comments: Abdomen soft, nondistended, no percussion tenderness, nontender to palpation; no rigidity, guarding, rebound tenderness; soft and reducible right inguinal hernia, nontender  Musculoskeletal:        General: Normal range of motion.     Cervical back: Normal range of motion.  Skin:    General: Skin is warm and dry.  Neurological:     General: No focal deficit present.     Mental Status: He is alert and oriented to person, place, and time.  Psychiatric:        Mood and Affect: Mood normal.        Behavior: Behavior normal.     Results: CT abdomen pelvis  (05/05/2022): IMPRESSION: 1. No acute findings in the abdomen or pelvis. Specifically, no findings to explain the patient's history of right lower quadrant pain. The appendix and terminal ileum are normal. 2. 13 mm subcapsular lesion in the lower pole right kidney may have some low level internal enhancement. Follow-up MRI with and without contrast recommended to further evaluate. 3. Small focus of collapse/consolidation in the left lower lobe. Pneumonia not excluded. 4. Tiny hiatal hernia. 5. Small bilateral groin and umbilical hernias contain only fat. 6. Aortic Atherosclerosis (ICD10-I70.0).   Assessment & Plan:  Justin Gill is a 66 y.o. male who presents for evaluation of right inguinal hernia  -I discussed the pathophysiology of inguinal hernias, and why we recommend surgical repair. -  The risk and benefits of robotic right inguinal hernia repair with mesh, possible bilateral were discussed including but not limited to bleeding, infection, injury to surrounding structures, need for additional procedures, and hernia recurrence.  After careful consideration, Justin Gill has decided to proceed with robotic hernia repair. -Information provided to the patient regarding inguinal hernias -Patient tentatively scheduled for surgery on 11/16 -Advised the patient to present to the emergency department if he begins to have a nonreducible tender bulge, nausea, vomiting, and obstipation  All questions were answered to the satisfaction of the patient.   Graciella Freer, DO [facility] [address] [place], [place] [address] [phone] (office)

## 2022-05-26 ENCOUNTER — Ambulatory Visit (HOSPITAL_COMMUNITY): Payer: Medicare Other

## 2022-06-09 NOTE — Patient Instructions (Signed)
Justin Gill  06/09/2022     '@[email or web address]'$ @   Your procedure is scheduled on  06/12/2022.   Report to [facility] at  0600  A.M.   Call this number if you have problems the morning of surgery:  [phone]  If you experience any cold or flu symptoms such as cough, fever, chills, shortness of breath, etc. between now and your scheduled surgery, please notify us at the above number.   Remember:  Do not eat or drink after midnight.         Use your inhalers before you come and bring your rescue inhaler with you.    Take these medicines the morning of surgery with A SIP OF WATER                                 amlodipine.    Do not wear jewelry, make-up or nail polish.  Do not wear lotions, powders, or perfumes, or deodorant.  Do not shave 48 hours prior to surgery.  Men may shave face and neck.  Do not bring valuables to the hospital.  [facility] is not responsible for any belongings or valuables.  Contacts, dentures or bridgework may not be worn into surgery.  Leave your suitcase in the car.  After surgery it may be brought to your room.  For patients admitted to the hospital, discharge time will be determined by your treatment team.  Patients discharged the day of surgery will not be allowed to drive home and must have someone with them for 24 hours.    Special instructions:   DO NOT smoke tobacco or vape for 24 hours before your procedure.  Please read over the following fact sheets that you were given. Pain Booklet, Coughing and Deep Breathing, Blood Transfusion Information, Surgical Site Infection Prevention, Anesthesia Post-op Instructions, and Care and Recovery After Surgery      Laparoscopic Inguinal Hernia Repair, Adult, Care After The following information offers guidance on how to care for yourself after your procedure. Your health care provider may also give you more specific instructions. If you have problems or questions, contact your  health care provider. What can I expect after the procedure? After the procedure, it is common to have: Pain. Swelling and bruising around the incision area. Scrotal swelling, in males. Some fluid or blood draining from your incisions. Follow these instructions at home: Medicines Take over-the-counter and prescription medicines only as told by your health care provider. Ask your health care provider if the medicine prescribed to you: Requires you to avoid driving or using machinery. Can cause constipation. You may need to take these actions to prevent or treat constipation: Drink enough fluid to keep your urine pale yellow. Take over-the-counter or prescription medicines. Eat foods that are high in fiber, such as beans, whole grains, and fresh fruits and vegetables. Limit foods that are high in fat and processed sugars, such as fried or sweet foods. Incision care  Follow instructions from your health care provider about how to take care of your incisions. Make sure you: Wash your hands with soap and water for at least 20 seconds before and after you change your bandage (dressing). If soap and water are not available, use hand sanitizer. Change your dressing as told by your health care provider. Leave stitches (sutures), skin glue, or adhesive strips in place. These skin closures may need to  stay in place for 2 weeks or longer. If adhesive strip edges start to loosen and curl up, you may trim the loose edges. Do not remove adhesive strips completely unless your health care provider tells you to do that. Check your incision area every day for signs of infection. Check for: More redness, swelling, or pain. More fluid or blood. Warmth. Pus or a bad smell. Wear loose, soft clothing while your incisions heal. Managing pain and swelling If directed, put ice on the painful or swollen areas. To do this: Put ice in a plastic bag. Place a towel between your skin and the bag. Leave the ice on  for 20 minutes, 2-3 times a day. Remove the ice if your skin turns bright red. This is very important. If you cannot feel pain, heat, or cold, you have a greater risk of damage to the area.  Activity Do not lift anything that is heavier than 10 lb (4.5 kg), or the limit that you are told, until your health care provider says that it is safe. Ask your health care provider what activities are safe for you. A lot of activity during the first week after surgery can increase pain and swelling. For 1 week after your procedure: Avoid activities that take a lot of effort, such as exercise or sports. You may walk and climb stairs as needed for daily activity, but avoid long walks or climbing stairs for exercise. General instructions If you were given a sedative during the procedure, it can affect you for several hours. Do not drive or operate machinery until your health care provider says that it is safe. Do not take baths, swim, or use a hot tub until your health care provider approves. Ask your health care provider if you may take showers. You may only be allowed to take sponge baths. Do not use any products that contain nicotine or tobacco. These products include cigarettes, chewing tobacco, and vaping devices, such as e-cigarettes. If you need help quitting, ask your health care provider. Keep all follow-up visits. This is important. Contact a health care provider if: You have any of these signs of infection: More redness, swelling, or pain around your incisions or your groin area. More fluid or blood coming from an incision. Warmth coming from an incision. Pus or a bad smell coming from an incision. A fever or chills. You have more swelling in your scrotum, if you are male. You have severe pain and medicines do not help. You have abdominal pain or swelling. You cannot urinate or have a bowel movement. You faint or feel dizzy. You have nausea and vomiting. Get help right away if: You have  redness, warmth, or pain in your leg. You have chest pain. You have problems breathing. These symptoms may represent a serious problem that is an emergency. Do not wait to see if the symptoms will go away. Get medical help right away. Call your local emergency services (911 in the [place] Do not drive yourself to the hospital. Summary Pain, swelling, and bruising are common after the procedure. Check your incision area every day for signs of infection, such as more redness, swelling, or pain. Put ice on painful or swollen areas for 20 minutes, 2-3 times a day. This information is not intended to replace advice given to you by your health care provider. Make sure you discuss any questions you have with your health care provider. Document Revised: 03/13/2020 Document Reviewed: 03/13/2020 Elsevier Patient Education  [place]  Anesthesia, Adult, Care After The following information offers guidance on how to care for yourself after your procedure. Your health care provider may also give you more specific instructions. If you have problems or questions, contact your health care provider. What can I expect after the procedure? After the procedure, it is common for people to: Have pain or discomfort at the IV site. Have nausea or vomiting. Have a sore throat or hoarseness. Have trouble concentrating. Feel cold or chills. Feel weak, sleepy, or tired (fatigue). Have soreness and body aches. These can affect parts of the body that were not involved in surgery. Follow these instructions at home: For the time period you were told by your health care provider:  Rest. Do not participate in activities where you could fall or become injured. Do not drive or use machinery. Do not drink alcohol. Do not take sleeping pills or medicines that cause drowsiness. Do not make important decisions or sign legal documents. Do not take care of children on your own. General instructions Drink  enough fluid to keep your urine pale yellow. If you have sleep apnea, surgery and certain medicines can increase your risk for breathing problems. Follow instructions from your health care provider about wearing your sleep device: Anytime you are sleeping, including during daytime naps. While taking prescription pain medicines, sleeping medicines, or medicines that make you drowsy. Return to your normal activities as told by your health care provider. Ask your health care provider what activities are safe for you. Take over-the-counter and prescription medicines only as told by your health care provider. Do not use any products that contain nicotine or tobacco. These products include cigarettes, chewing tobacco, and vaping devices, such as e-cigarettes. These can delay incision healing after surgery. If you need help quitting, ask your health care provider. Contact a health care provider if: You have nausea or vomiting that does not get better with medicine. You vomit every time you eat or drink. You have pain that does not get better with medicine. You cannot urinate or have bloody urine. You develop a skin rash. You have a fever. Get help right away if: You have trouble breathing. You have chest pain. You vomit blood. These symptoms may be an emergency. Get help right away. Call 911. Do not wait to see if the symptoms will go away. Do not drive yourself to the hospital. Summary After the procedure, it is common to have a sore throat, hoarseness, nausea, vomiting, or to feel weak, sleepy, or fatigue. For the time period you were told by your health care provider, do not drive or use machinery. Get help right away if you have difficulty breathing, have chest pain, or vomit blood. These symptoms may be an emergency. This information is not intended to replace advice given to you by your health care provider. Make sure you discuss any questions you have with your health care  provider. Document Revised: 10/11/2021 Document Reviewed: 10/11/2021 Elsevier Patient Education  [place]. How to Use Chlorhexidine Before Surgery Chlorhexidine gluconate (CHG) is a germ-killing (antiseptic) solution that is used to clean the skin. It can get rid of the bacteria that normally live on the skin and can keep them away for about 24 hours. To clean your skin with CHG, you may be given: A CHG solution to use in the shower or as part of a sponge bath. A prepackaged cloth that contains CHG. Cleaning your skin with CHG may help lower the risk for infection: While you  are staying in the intensive care unit of the hospital. If you have a vascular access, such as a central line, to provide short-term or long-term access to your veins. If you have a catheter to drain urine from your bladder. If you are on a ventilator. A ventilator is a machine that helps you breathe by moving air in and out of your lungs. After surgery. What are the risks? Risks of using CHG include: A skin reaction. Hearing loss, if CHG gets in your ears and you have a perforated eardrum. Eye injury, if CHG gets in your eyes and is not rinsed out. The CHG product catching fire. Make sure that you avoid smoking and flames after applying CHG to your skin. Do not use CHG: If you have a chlorhexidine allergy or have previously reacted to chlorhexidine. On babies younger than 54 months of age. How to use CHG solution Use CHG only as told by your health care provider, and follow the instructions on the label. Use the full amount of CHG as directed. Usually, this is one bottle. During a shower Follow these steps when using CHG solution during a shower (unless your health care provider gives you different instructions): Start the shower. Use your normal soap and shampoo to wash your face and hair. Turn off the shower or move out of the shower stream. Pour the CHG onto a clean washcloth. Do not use any type of  brush or rough-edged sponge. Starting at your neck, lather your body down to your toes. Make sure you follow these instructions: If you will be having surgery, pay special attention to the part of your body where you will be having surgery. Scrub this area for at least 1 minute. Do not use CHG on your head or face. If the solution gets into your ears or eyes, rinse them well with water. Avoid your genital area. Avoid any areas of skin that have broken skin, cuts, or scrapes. Scrub your back and under your arms. Make sure to wash skin folds. Let the lather sit on your skin for 1-2 minutes or as long as told by your health care provider. Thoroughly rinse your entire body in the shower. Make sure that all body creases and crevices are rinsed well. Dry off with a clean towel. Do not put any substances on your body afterward--such as powder, lotion, or perfume--unless you are told to do so by your health care provider. Only use lotions that are recommended by the manufacturer. Put on clean clothes or pajamas. If it is the night before your surgery, sleep in clean sheets.  During a sponge bath Follow these steps when using CHG solution during a sponge bath (unless your health care provider gives you different instructions): Use your normal soap and shampoo to wash your face and hair. Pour the CHG onto a clean washcloth. Starting at your neck, lather your body down to your toes. Make sure you follow these instructions: If you will be having surgery, pay special attention to the part of your body where you will be having surgery. Scrub this area for at least 1 minute. Do not use CHG on your head or face. If the solution gets into your ears or eyes, rinse them well with water. Avoid your genital area. Avoid any areas of skin that have broken skin, cuts, or scrapes. Scrub your back and under your arms. Make sure to wash skin folds. Let the lather sit on your skin for 1-2 minutes or as long  as told by  your health care provider. Using a different clean, wet washcloth, thoroughly rinse your entire body. Make sure that all body creases and crevices are rinsed well. Dry off with a clean towel. Do not put any substances on your body afterward--such as powder, lotion, or perfume--unless you are told to do so by your health care provider. Only use lotions that are recommended by the manufacturer. Put on clean clothes or pajamas. If it is the night before your surgery, sleep in clean sheets. How to use CHG prepackaged cloths Only use CHG cloths as told by your health care provider, and follow the instructions on the label. Use the CHG cloth on clean, dry skin. Do not use the CHG cloth on your head or face unless your health care provider tells you to. When washing with the CHG cloth: Avoid your genital area. Avoid any areas of skin that have broken skin, cuts, or scrapes. Before surgery Follow these steps when using a CHG cloth to clean before surgery (unless your health care provider gives you different instructions): Using the CHG cloth, vigorously scrub the part of your body where you will be having surgery. Scrub using a back-and-forth motion for 3 minutes. The area on your body should be completely wet with CHG when you are done scrubbing. Do not rinse. Discard the cloth and let the area air-dry. Do not put any substances on the area afterward, such as powder, lotion, or perfume. Put on clean clothes or pajamas. If it is the night before your surgery, sleep in clean sheets.  For general bathing Follow these steps when using CHG cloths for general bathing (unless your health care provider gives you different instructions). Use a separate CHG cloth for each area of your body. Make sure you wash between any folds of skin and between your fingers and toes. Wash your body in the following order, switching to a new cloth after each step: The front of your neck, shoulders, and chest. Both of your  arms, under your arms, and your hands. Your stomach and groin area, avoiding the genitals. Your right leg and foot. Your left leg and foot. The back of your neck, your back, and your buttocks. Do not rinse. Discard the cloth and let the area air-dry. Do not put any substances on your body afterward--such as powder, lotion, or perfume--unless you are told to do so by your health care provider. Only use lotions that are recommended by the manufacturer. Put on clean clothes or pajamas. Contact a health care provider if: Your skin gets irritated after scrubbing. You have questions about using your solution or cloth. You swallow any chlorhexidine. Call your local poison control center (1-(479) 322-0651 in the [place] Get help right away if: Your eyes itch badly, or they become very red or swollen. Your skin itches badly and is red or swollen. Your hearing changes. You have trouble seeing. You have swelling or tingling in your mouth or throat. You have trouble breathing. These symptoms may represent a serious problem that is an emergency. Do not wait to see if the symptoms will go away. Get medical help right away. Call your local emergency services (911 in the [place] Do not drive yourself to the hospital. Summary Chlorhexidine gluconate (CHG) is a germ-killing (antiseptic) solution that is used to clean the skin. Cleaning your skin with CHG may help to lower your risk for infection. You may be given CHG to use for bathing. It may be in a bottle or  in a prepackaged cloth to use on your skin. Carefully follow your health care provider's instructions and the instructions on the product label. Do not use CHG if you have a chlorhexidine allergy. Contact your health care provider if your skin gets irritated after scrubbing. This information is not intended to replace advice given to you by your health care provider. Make sure you discuss any questions you have with your health care provider. Document  Revised: 11/11/2021 Document Reviewed: 09/24/2020 Elsevier Patient Education  [place].

## 2022-06-10 ENCOUNTER — Encounter (HOSPITAL_COMMUNITY): Payer: Self-pay

## 2022-06-10 ENCOUNTER — Encounter (HOSPITAL_COMMUNITY)
Admission: RE | Admit: 2022-06-10 | Discharge: 2022-06-10 | Disposition: A | Discharge status: Home / Self Care | Payer: BC Managed Care – PPO | Source: Ambulatory Visit | Attending: Surgery | Admitting: Surgery

## 2022-06-10 ENCOUNTER — Other Ambulatory Visit: Payer: Self-pay

## 2022-06-10 VITALS — BP 145/93 | HR 74 | Temp 97.8°F | Resp 18 | Ht 72.0 in | Wt 175.0 lb

## 2022-06-10 DIAGNOSIS — I493 Ventricular premature depolarization: Secondary | ICD-10-CM | POA: Diagnosis not present

## 2022-06-10 DIAGNOSIS — I10 Essential (primary) hypertension: Secondary | ICD-10-CM | POA: Insufficient documentation

## 2022-06-10 DIAGNOSIS — I491 Atrial premature depolarization: Secondary | ICD-10-CM | POA: Insufficient documentation

## 2022-06-10 DIAGNOSIS — I517 Cardiomegaly: Secondary | ICD-10-CM | POA: Insufficient documentation

## 2022-06-10 DIAGNOSIS — Z01818 Encounter for other preprocedural examination: Secondary | ICD-10-CM | POA: Diagnosis not present

## 2022-06-10 LAB — TYPE AND SCREEN
ABO/RH(D): A POS
Antibody Screen: NEGATIVE

## 2022-06-12 ENCOUNTER — Other Ambulatory Visit: Payer: Self-pay

## 2022-06-12 ENCOUNTER — Encounter (HOSPITAL_COMMUNITY): Payer: Self-pay | Admitting: Surgery

## 2022-06-12 ENCOUNTER — Ambulatory Visit (HOSPITAL_COMMUNITY)
Admission: RE | Admit: 2022-06-12 | Discharge: 2022-06-12 | Disposition: A | Discharge status: Home / Self Care | Payer: BC Managed Care – PPO | Attending: Surgery | Admitting: Surgery

## 2022-06-12 ENCOUNTER — Encounter (HOSPITAL_COMMUNITY)
Admission: RE | Disposition: A | Discharge status: Home / Self Care | Payer: Self-pay | Source: Home / Self Care | Attending: Surgery

## 2022-06-12 ENCOUNTER — Ambulatory Visit (HOSPITAL_COMMUNITY): Payer: BC Managed Care – PPO | Admitting: Certified Registered Nurse Anesthetist

## 2022-06-12 DIAGNOSIS — Z7982 Long term (current) use of aspirin: Secondary | ICD-10-CM | POA: Insufficient documentation

## 2022-06-12 DIAGNOSIS — F17210 Nicotine dependence, cigarettes, uncomplicated: Secondary | ICD-10-CM

## 2022-06-12 DIAGNOSIS — K4090 Unilateral inguinal hernia, without obstruction or gangrene, not specified as recurrent: Secondary | ICD-10-CM

## 2022-06-12 DIAGNOSIS — I10 Essential (primary) hypertension: Secondary | ICD-10-CM

## 2022-06-12 DIAGNOSIS — J449 Chronic obstructive pulmonary disease, unspecified: Secondary | ICD-10-CM | POA: Diagnosis not present

## 2022-06-12 DIAGNOSIS — G8929 Other chronic pain: Secondary | ICD-10-CM

## 2022-06-12 HISTORY — PX: XI ROBOTIC ASSISTED INGUINAL HERNIA REPAIR WITH MESH: SHX6706

## 2022-06-12 SURGERY — REPAIR, HERNIA, INGUINAL, ROBOT-ASSISTED, LAPAROSCOPIC, USING MESH
Anesthesia: General | Site: Abdomen | Laterality: Right

## 2022-06-12 MED ORDER — ORAL CARE MOUTH RINSE: 15.0000 mL | Freq: Once | OROMUCOSAL | Status: DC

## 2022-06-12 MED ORDER — PROPOFOL 10 MG/ML IV BOLUS
INTRAVENOUS | Status: DC | PRN
  Administered 2022-06-12: 150 mg via INTRAVENOUS

## 2022-06-12 MED ORDER — IBUPROFEN 800 MG PO TABS: 800.0000 mg | ORAL_TABLET | Freq: Three times a day (TID) | ORAL | 0 refills | Status: AC

## 2022-06-12 MED ORDER — MIDAZOLAM HCL 5 MG/5ML IJ SOLN
INTRAMUSCULAR | Status: DC | PRN
  Administered 2022-06-12: 2 mg via INTRAVENOUS

## 2022-06-12 MED ORDER — DOCUSATE SODIUM 100 MG PO CAPS: 100.0000 mg | ORAL_CAPSULE | Freq: Two times a day (BID) | ORAL | 2 refills | Status: DC

## 2022-06-12 MED ORDER — MIDAZOLAM HCL 2 MG/2ML IJ SOLN
INTRAMUSCULAR | Status: AC
  Filled 2022-06-12: qty 2

## 2022-06-12 MED ORDER — BUPIVACAINE LIPOSOME 1.3 % IJ SUSP
INTRAMUSCULAR | Status: DC | PRN
  Administered 2022-06-12: 20 mL

## 2022-06-12 MED ORDER — ONDANSETRON HCL 4 MG/2ML IJ SOLN
INTRAMUSCULAR | Status: AC
  Filled 2022-06-12: qty 2

## 2022-06-12 MED ORDER — PHENYLEPHRINE HCL (PRESSORS) 10 MG/ML IV SOLN
INTRAVENOUS | Status: DC | PRN
  Administered 2022-06-12 (×5): 100 ug via INTRAVENOUS

## 2022-06-12 MED ORDER — BUPIVACAINE LIPOSOME 1.3 % IJ SUSP
INTRAMUSCULAR | Status: AC
  Filled 2022-06-12: qty 20

## 2022-06-12 MED ORDER — CHLORHEXIDINE GLUCONATE CLOTH 2 % EX PADS: 6.0000 | MEDICATED_PAD | Freq: Once | CUTANEOUS | Status: DC

## 2022-06-12 MED ORDER — FENTANYL CITRATE PF 50 MCG/ML IJ SOSY
25.0000 ug | PREFILLED_SYRINGE | INTRAMUSCULAR | Status: DC | PRN
  Administered 2022-06-12: 50 ug via INTRAVENOUS
  Filled 2022-06-12: qty 1

## 2022-06-12 MED ORDER — FENTANYL CITRATE (PF) 100 MCG/2ML IJ SOLN
INTRAMUSCULAR | Status: DC | PRN
  Administered 2022-06-12 (×3): 50 ug via INTRAVENOUS

## 2022-06-12 MED ORDER — CHLORHEXIDINE GLUCONATE 0.12 % MT SOLN: 15.0000 mL | Freq: Once | OROMUCOSAL | Status: DC

## 2022-06-12 MED ORDER — OXYCODONE HCL 5 MG PO TABS: 5.0000 mg | ORAL_TABLET | Freq: Four times a day (QID) | ORAL | 0 refills | Status: DC | PRN

## 2022-06-12 MED ORDER — CHLORHEXIDINE GLUCONATE 0.12 % MT SOLN
OROMUCOSAL | Status: AC
  Administered 2022-06-12: 15 mL
  Filled 2022-06-12: qty 15

## 2022-06-12 MED ORDER — ONDANSETRON HCL 4 MG/2ML IJ SOLN: 4.0000 mg | Freq: Once | INTRAMUSCULAR | Status: DC | PRN

## 2022-06-12 MED ORDER — ROCURONIUM BROMIDE 10 MG/ML (PF) SYRINGE
PREFILLED_SYRINGE | INTRAVENOUS | Status: AC
  Filled 2022-06-12: qty 10

## 2022-06-12 MED ORDER — STERILE WATER FOR IRRIGATION IR SOLN
Status: DC | PRN
  Administered 2022-06-12: 500 mL

## 2022-06-12 MED ORDER — LIDOCAINE HCL (CARDIAC) PF 50 MG/5ML IV SOSY
PREFILLED_SYRINGE | INTRAVENOUS | Status: DC | PRN
  Administered 2022-06-12: 100 mg via INTRAVENOUS

## 2022-06-12 MED ORDER — PHENYLEPHRINE 80 MCG/ML (10ML) SYRINGE FOR IV PUSH (FOR BLOOD PRESSURE SUPPORT)
PREFILLED_SYRINGE | INTRAVENOUS | Status: AC
  Filled 2022-06-12: qty 10

## 2022-06-12 MED ORDER — OXYCODONE HCL 5 MG/5ML PO SOLN: 5.0000 mg | Freq: Once | ORAL | Status: DC | PRN

## 2022-06-12 MED ORDER — LACTATED RINGERS IV SOLN: INTRAVENOUS | Status: DC

## 2022-06-12 MED ORDER — ONDANSETRON HCL 4 MG/2ML IJ SOLN
INTRAMUSCULAR | Status: DC | PRN
  Administered 2022-06-12: 4 mg via INTRAVENOUS

## 2022-06-12 MED ORDER — BUPIVACAINE-EPINEPHRINE (PF) 0.5% -1:200000 IJ SOLN
INTRAMUSCULAR | Status: AC
  Filled 2022-06-12: qty 30

## 2022-06-12 MED ORDER — BUPIVACAINE-EPINEPHRINE (PF) 0.25% -1:200000 IJ SOLN
INTRAMUSCULAR | Status: AC
  Filled 2022-06-12: qty 30

## 2022-06-12 MED ORDER — LIDOCAINE HCL (PF) 2 % IJ SOLN
INTRAMUSCULAR | Status: AC
  Filled 2022-06-12: qty 5

## 2022-06-12 MED ORDER — PROPOFOL 10 MG/ML IV BOLUS
INTRAVENOUS | Status: AC
  Filled 2022-06-12: qty 20

## 2022-06-12 MED ORDER — FENTANYL CITRATE (PF) 250 MCG/5ML IJ SOLN
INTRAMUSCULAR | Status: AC
  Filled 2022-06-12: qty 5

## 2022-06-12 MED ORDER — OXYCODONE HCL 5 MG PO TABS: 5.0000 mg | ORAL_TABLET | Freq: Once | ORAL | Status: DC | PRN

## 2022-06-12 MED ORDER — CEFAZOLIN SODIUM-DEXTROSE 2-4 GM/100ML-% IV SOLN
2.0000 g | INTRAVENOUS | Status: AC
  Administered 2022-06-12: 2 g via INTRAVENOUS

## 2022-06-12 MED ORDER — ROCURONIUM 10MG/ML (10ML) SYRINGE FOR MEDFUSION PUMP - OPTIME
INTRAVENOUS | Status: DC | PRN
  Administered 2022-06-12: 20 mg via INTRAVENOUS
  Administered 2022-06-12: 50 mg via INTRAVENOUS

## 2022-06-12 MED ORDER — CEFAZOLIN SODIUM-DEXTROSE 2-4 GM/100ML-% IV SOLN
INTRAVENOUS | Status: AC
  Filled 2022-06-12: qty 100

## 2022-06-12 SURGICAL SUPPLY — 47 items
ADH SKN CLS APL DERMABOND .7 (GAUZE/BANDAGES/DRESSINGS) ×1
COVER MAYO STAND XLG (MISCELLANEOUS) IMPLANT
COVER TIP SHEARS 8 DVNC (MISCELLANEOUS) ×1 IMPLANT
COVER TIP SHEARS 8MM DA VINCI (MISCELLANEOUS) ×1
DERMABOND ADVANCED .7 DNX12 (GAUZE/BANDAGES/DRESSINGS) ×1 IMPLANT
DRAPE 3/4 80X56 (DRAPES) ×1 IMPLANT
DRAPE ARM DVNC X/XI (DISPOSABLE) ×3 IMPLANT
DRAPE COLUMN DVNC XI (DISPOSABLE) ×1 IMPLANT
DRAPE DA VINCI XI ARM (DISPOSABLE) ×3
DRAPE DA VINCI XI COLUMN (DISPOSABLE) ×1
DRAPE HALF SHEET 40X57 (DRAPES) IMPLANT
DRAPE UTILITY W/TAPE 26X15 (DRAPES) IMPLANT
ELECT REM PT RETURN 9FT ADLT (ELECTROSURGICAL) ×1
ELECTRODE REM PT RTRN 9FT ADLT (ELECTROSURGICAL) ×1 IMPLANT
GLOVE BIOGEL PI IND STRL 6.5 (GLOVE) IMPLANT
GLOVE BIOGEL PI IND STRL 7.0 (GLOVE) ×1 IMPLANT
GLOVE ECLIPSE 6.5 STRL STRAW (GLOVE) IMPLANT
GLOVE SURG SS PI 6.5 STRL IVOR (GLOVE) IMPLANT
GLOVE SURG SS PI 7.5 STRL IVOR (GLOVE) ×2 IMPLANT
GOWN STRL REUS W/TWL LRG LVL3 (GOWN DISPOSABLE) ×2 IMPLANT
KIT PINK PAD W/HEAD ARE REST (MISCELLANEOUS)
KIT PINK PAD W/HEAD ARM REST (MISCELLANEOUS) ×1 IMPLANT
KIT TURNOVER KIT A (KITS) ×1 IMPLANT
LABEL OR SOLS (LABEL) ×1 IMPLANT
MESH 3DMAX MID 5X7 RT XLRG (Mesh General) IMPLANT
NDL INSUFFLATION 14GA 120MM (NEEDLE) ×1 IMPLANT
NEEDLE HYPO 22GX1.5 SAFETY (NEEDLE) ×1 IMPLANT
NEEDLE INSUFFLATION 14GA 120MM (NEEDLE) ×2 IMPLANT
OBTURATOR OPTICAL STANDARD 8MM (TROCAR) ×1
OBTURATOR OPTICAL STND 8 DVNC (TROCAR) ×1
OBTURATOR OPTICALSTD 8 DVNC (TROCAR) ×1 IMPLANT
PACK LAP CHOLECYSTECTOMY (MISCELLANEOUS) ×1 IMPLANT
PAD POSITIONING PINK XL (MISCELLANEOUS) IMPLANT
PENCIL SMOKE EVACUATOR (MISCELLANEOUS) ×1 IMPLANT
SEAL CANN UNIV 5-8 DVNC XI (MISCELLANEOUS) ×3 IMPLANT
SEAL XI 5MM-8MM UNIVERSAL (MISCELLANEOUS) ×3
SET TUBE SMOKE EVAC HIGH FLOW (TUBING) ×1 IMPLANT
SOLUTION ELECTROLUBE (MISCELLANEOUS) ×1 IMPLANT
SUT MNCRL AB 4-0 PS2 18 (SUTURE) ×1 IMPLANT
SUT V-LOC 90 ABS 3-0 VLT  V-20 (SUTURE) ×2
SUT V-LOC 90 ABS 3-0 VLT V-20 (SUTURE) IMPLANT
SUT VIC AB 2-0 SH 27 (SUTURE) ×1
SUT VIC AB 2-0 SH 27X BRD (SUTURE) IMPLANT
TAPE TRANSPORE STRL 2 31045 (GAUZE/BANDAGES/DRESSINGS) ×1 IMPLANT
TRAY FOL W/BAG SLVR 16FR STRL (SET/KITS/TRAYS/PACK) ×1 IMPLANT
TRAY FOLEY W/BAG SLVR 16FR LF (SET/KITS/TRAYS/PACK) ×1
WATER STERILE IRR 500ML POUR (IV SOLUTION) ×1 IMPLANT

## 2022-06-12 NOTE — Op Note (Addendum)
Preoperative diagnosis: Right inguinal Hernia.  Postoperative diagnosis: Right inguinal Hernia  Procedure: Robotic assisted laparoscopic right inguinal hernia repair with mesh  Anesthesia: General  Surgeon: Graciella Freer, DO  Intuitive Proctor: Penni Homans, MD  Wound Classification: Clean  Specimen: none  Complications: None  Estimated Blood Loss: 13m   Indications: Patient is a 66year old who presents for robotic assisted laparoscopic right inguinal hernia repair with mesh.  This hernia that has been present for 3 weeks, and causes him pain.  He presented to the emergency department, and underwent a CT of the abdomen pelvis which demonstrated a right-sided inguinal hernia.  He is agreeable to surgical intervention at this time.  All risks and benefits of performing this procedure were discussed with the patient including pain, infection, bleeding, damage to the surrounding structures, and need for more procedures or surgery. The patient voiced understanding of the procedure, all questions were sought and answered, and consent was obtained.  Findings: 1. Vas Deferens and cord structures identified and preserved 2. Bard 3D max medium weight mesh used for repair 3. Adequate hemostasis achieved  Description of procedure: The patient was taken to the operating room. A time-out was completed verifying correct patient, procedure, site, positioning, and implant(s) and/or special equipment prior to beginning this procedure.  Area was prepped and draped in the usual sterile fashion. An incision was marked 20 cm above the pubic tubercle, slightly above the umbilicus    Veress needle inserted at palmer's point.  Saline drop test noted to be positive with gradual increase in pressure after initiation of gas insufflation.  15 mm of pressure was achieved prior to removing the Veress needle and then placing a 8 mm port via the Optiview technique through the supraumbilical site.   Inspection of the area afterwards noted no injury to the surrounding organs during insertion of the needle and the port.  2 port sites were marked 8 cm to the lateral sides of the initial port, and a 8 mm robotic port was placed on the left side, another 8 mm robotic port on the right side under direct supervision.  Local anesthesia  infused to the preplanned incision sites prior to insertion of the port.  The dOkeenewas then brought into the operative field and docked to the ports.  Examination of the abdominal cavity noted a right inguinal hernia, and no evidence of a left inguinal hernia.  A peritoneal flap was created approximately 8cm cephalad to the defect by using scissors with electrocautery.  Dissection was carried down towards the pubic tubercle, developing the myopectineal orifice view.  Laterally the flap was carried towards the ASIS.  Small hernia sac was noted, which carefully dissected away from the adjacent tissues to be fully reduced out of hernia cavity.  Any bleeding was controlled with combination of electrocautery and manual pressure.    After confirming adequate dissection and the peritoneal reflection completely down and away from the cord structures, a Large Bard 3DMax medium weight mesh was placed within the anterior abdominal wall, secured in place using 2-0 Vicryl on an SH needle immediately above the pubic tubercle.  After noting proper placement of the mesh with the peritoneal reflection deep to it, the previously created peritoneal flap was secured back up to the anterior abdominal wall using running 3-0 V-Lock.  Both needles were then removed out of the abdominal cavity, Xi platform undocked from the ports and removed off of operative field.  Exparel was infused at the incision sites.  Abdomen then desufflated and ports removed under direct visualization. All the skin incisions were then closed with a subcuticular stitch of Monocryl 4-0. Dermabond was applied. The  testis was gently pulled down into its anatomic position in the scrotum.  The patient tolerated the procedure well and was taken to the postanesthesia care unit in stable condition. Sponge and instrument count correct at end of procedure.  Graciella Freer, DO [facility] [address] [place], [place] [id number] [phone] (office)

## 2022-06-12 NOTE — Interval H&P Note (Signed)
History and Physical Interval Note:  06/12/2022 7:22 AM  Justin Gill  has presented today for surgery, with the diagnosis of RIGHT INGUINAL HERNIA POSSIBLE BILATERAL INGUINAL HERNIA.  The various methods of treatment have been discussed with the patient and family. After consideration of risks, benefits and other options for treatment, the patient has consented to  Procedure(s): XI ROBOTIC [place], POSSIBLE BILATERAL (Right) as a surgical intervention.  The patient's history has been reviewed, patient examined, no change in status, stable for surgery.  I have reviewed the patient's chart and labs.  Questions were answered to the patient's satisfaction.     [place]

## 2022-06-12 NOTE — Anesthesia Postprocedure Evaluation (Signed)
Anesthesia Post Note  Patient: Justin Gill  Procedure(s) Performed: XI ROBOTIC ASSISTED INGUINAL HERNIA REPAIR WITH MESH (Right: Abdomen)  Patient location during evaluation: PACU Anesthesia Type: General Level of consciousness: awake and alert Pain management: pain level controlled Vital Signs Assessment: post-procedure vital signs reviewed and stable Respiratory status: spontaneous breathing, nonlabored ventilation, respiratory function stable and patient connected to nasal cannula oxygen Cardiovascular status: blood pressure returned to baseline and stable Postop Assessment: no apparent nausea or vomiting Anesthetic complications: no   No notable events documented.   Last Vitals:  Vitals:   06/12/22 1148 06/12/22 1201  BP: (!) 141/81 136/87  Pulse: 94 98  Resp: 14 15  Temp:  36.7 C  SpO2: 91% 94%    Last Pain:  Vitals:   06/12/22 1201  TempSrc: Oral  PainSc: 4                  [name] Clyde [place]

## 2022-06-12 NOTE — Anesthesia Procedure Notes (Addendum)
Procedure Name: Intubation Date/Time: 06/12/2022 7:51 AM  Performed by: Ollen Bowl, CRNAPre-anesthesia Checklist: Patient identified, Patient being monitored, Timeout performed, Emergency Drugs available and Suction available Patient Re-evaluated:Patient Re-evaluated prior to induction Oxygen Delivery Method: Circle system utilized Preoxygenation: Pre-oxygenation with 100% oxygen Induction Type: IV induction Ventilation: Mask ventilation without difficulty Laryngoscope Size: Mac, 3 and Glidescope (S3) Grade View: Grade III Tube type: Oral Tube size: 7.5 mm Number of attempts: 1 Airway Equipment and Method: Stylet and Rigid stylet Placement Confirmation: ETT inserted through vocal cords under direct vision, positive ETCO2 and breath sounds checked- equal and bilateral Secured at: 23 cm Tube secured with: Tape Dental Injury: Teeth and Oropharynx as per pre-operative assessment  Comments: Grade 3 view with MAC 3, very poor dentition.  Grade 1 view with glidescope and ETT passed easily through cords.

## 2022-06-12 NOTE — Discharge Instructions (Signed)
Ambulatory Surgery Discharge Instructions  General Anesthesia or Sedation Do not drive or operate heavy machinery for 24 hours.  Do not consume alcohol, tranquilizers, sleeping medications, or any non-prescribed medications for 24 hours. Do not make important decisions or sign any important papers in the next 24 hours. You should have someone with you tonight at home.  Activity  You are advised to go directly home from the hospital.  Restrict your activities and rest for a day.  Resume light activity tomorrow. No heavy lifting over 10 lbs or strenuous exercise for the next 4 weeks.  Fluids and Diet Begin with clear liquids, bouillon, dry toast, soda crackers.  If not nauseated, you may go to a regular diet when you desire.  Greasy and spicy foods are not advised.  Medications  If you have not had a bowel movement in 24 hours, take 2 tablespoons over the counter Milk of mag.             You May resume your blood thinners tomorrow (Aspirin, coumadin, or other).  You are being discharged with prescriptions for Opioid/Narcotic Medications: There are some specific considerations for these medications that you should know. Opioid Meds have risks & benefits. Addiction to these meds is always a concern with prolonged use Take medication only as directed Do not drive while taking narcotic pain medication Do not crush tablets or capsules Do not use a different container than medication was dispensed in Lock the container of medication in a cool, dry place out of reach of children and pets. Opioid medication can cause addiction Do not share with anyone else (this is a felony) Do not store medications for future use. Dispose of them properly.     Disposal:  Find a [employer] household drug take back site near you.  If you can't get to a drug take back site, use the recipe below as a last resort to dispose of expired, unused or unwanted drugs. Disposal  (Do not dispose chemotherapy drugs this  way, talk to your prescribing doctor instead.) Step 1: Mix drugs (do not crush) with dirt, kitty litter, or used coffee grounds and add a small amount of water to dissolve any solid medications. Step 2: Seal drugs in plastic bag. Step 3: Place plastic bag in trash. Step 4: Take prescription container and scratch out personal information, then recycle or throw away.  Operative Site  You have a liquid bandage over your incisions, this will begin to flake off in about a week. Ok to Games developer. Keep wound clean and dry. No baths or swimming. No lifting more than 10 pounds.  Contact Information: If you have questions or concerns, please call our office, [phone], Monday- Thursday 8AM-5PM and Friday 8AM-12Noon.  If it is after hours or on the weekend, please call [facility], [phone], and ask to speak to the surgeon on call for Dr. Okey Dupre at [facility].   SPECIFIC COMPLICATIONS TO WATCH FOR: Inability to urinate Fever over 101? F by mouth Nausea and vomiting lasting longer than 24 hours. Pain not relieved by medication ordered Swelling around the operative site Increased redness, warmth, hardness, around operative area Numbness, tingling, or cold fingers or toes Blood -soaked dressing, (small amounts of oozing may be normal) Increasing and progressive drainage from surgical area or exam site

## 2022-06-12 NOTE — Transfer of Care (Signed)
Immediate Anesthesia Transfer of Care Note  Patient: Justin Gill  Procedure(s) Performed: XI ROBOTIC ASSISTED INGUINAL HERNIA REPAIR WITH MESH (Right: Abdomen)  Patient Location: PACU  Anesthesia Type:General  Level of Consciousness: awake  Airway & Oxygen Therapy: Patient Spontanous Breathing  Post-op Assessment: Report given to RN  Post vital signs: Reviewed and stable  Last Vitals:  Vitals Value Taken Time  BP 118/72 06/12/22 1048  Temp    Pulse 93 06/12/22 1055  Resp 16 06/12/22 1055  SpO2 94 % 06/12/22 1055  Vitals shown include unvalidated device data.  Last Pain:  Vitals:   06/12/22 0649  PainSc: 0-No pain         Complications: No notable events documented.

## 2022-06-12 NOTE — Anesthesia Preprocedure Evaluation (Signed)
Anesthesia Evaluation  Patient identified by MRN, date of birth, ID band Patient awake    Reviewed: Allergy & Precautions, H&P , NPO status , Patient's Chart, lab work & pertinent test results, reviewed documented beta blocker date and time   Airway Mallampati: II  TM Distance: >3 FB Neck ROM: full    Dental  (+) Missing, Poor Dentition   Pulmonary asthma , COPD, Current Smoker and Patient abstained from smoking.   Pulmonary exam normal breath sounds clear to auscultation       Cardiovascular Exercise Tolerance: Good hypertension, negative cardio ROS  Rhythm:regular Rate:Normal     Neuro/Psych negative neurological ROS  negative psych ROS   GI/Hepatic negative GI ROS,,,(+)     substance abuse  alcohol use  Endo/Other  negative endocrine ROS    Renal/GU negative Renal ROS  negative genitourinary   Musculoskeletal   Abdominal   Peds  Hematology negative hematology ROS (+)   Anesthesia Other Findings   Reproductive/Obstetrics negative OB ROS                             Anesthesia Physical Anesthesia Plan  ASA: 3  Anesthesia Plan: General ETT   Post-op Pain Management:    Induction:   PONV Risk Score and Plan:   Airway Management Planned:   Additional Equipment:   Intra-op Plan:   Post-operative Plan:   Informed Consent: I have reviewed the patients History and Physical, chart, labs and discussed the procedure including the risks, benefits and alternatives for the proposed anesthesia with the patient or authorized representative who has indicated his/her understanding and acceptance.     Dental Advisory Given  Plan Discussed with: CRNA  Anesthesia Plan Comments:         Anesthesia Quick Evaluation

## 2022-06-12 NOTE — Progress Notes (Addendum)
[facility]  Spoke with the patient's significant other in the consultation room.  I explained that he tolerated the procedure without difficulty.  He has 3 small incisions on his abdomen, which have dissolvable stitches under the skin, and overlying skin glue.  The skin glue will flake off in 10 to 14 days.  I will send him home with a prescription for narcotic pain medication, which he should take as needed for pain.  If he is taking the narcotic pain medication, he should take a stool softener as well.  I also want him taking scheduled ibuprofen, which she should take with food.  He will follow-up with me in 2 weeks.  He should not lift anything more than 10 pounds for the next 4 to 6 weeks.  All questions were answered to her expressed satisfaction.  Graciella Freer, DO [facility] [address] [place], [place] [address] [phone] (office)

## 2022-06-23 ENCOUNTER — Telehealth: Payer: Self-pay | Admitting: Family Medicine

## 2022-06-23 ENCOUNTER — Encounter (HOSPITAL_COMMUNITY): Payer: Self-pay | Admitting: Surgery

## 2022-06-23 NOTE — Telephone Encounter (Signed)
06/18/2022 - FMLA paperwork completed and faxed on to [employer] at [phone]. Confirmation received.  Patient out of work starting 06/12/22 and may return to work unrestricted on 08/11/2022 for right inguinal hernia repair.

## 2022-06-26 ENCOUNTER — Encounter: Payer: Medicare Other | Admitting: Surgery

## 2022-06-26 ENCOUNTER — Ambulatory Visit (INDEPENDENT_AMBULATORY_CARE_PROVIDER_SITE_OTHER): Payer: Medicare Other | Admitting: Surgery

## 2022-06-26 VITALS — BP 161/95 | HR 88 | Temp 98.8°F | Resp 16 | Ht 72.0 in | Wt 175.0 lb

## 2022-06-26 DIAGNOSIS — Z09 Encounter for follow-up examination after completed treatment for conditions other than malignant neoplasm: Secondary | ICD-10-CM

## 2022-06-26 NOTE — Progress Notes (Signed)
[facility] Note   HPI:  66 y.o. Male presents to clinic for post-op follow-up status post robotic assisted laparoscopic right inguinal hernia repair with mesh on 11/16.  He has been doing well since the surgery.  He confirms abdominal pain and a little bit of right groin pain, but the pain has been continuing to improve.  He is currently taking ibuprofen for the pain.  He is tolerating a diet without nausea and vomiting.  He denies any issues at his incision sites.  Review of Systems:  All other review of systems: otherwise negative   Vital Signs:  BP (!) 161/95   Pulse 88   Temp 98.8 F (37.1 C) (Oral)   Resp 16   Ht 6' (1.829 m)   Wt 175 lb (79.4 kg)   SpO2 94%   BMI 23.73 kg/m    Physical Exam:  Physical Exam Vitals reviewed.  Constitutional:      Appearance: Normal appearance.  Abdominal:     Comments: Abdomen soft, nondistended, no percussion tenderness, nontender to palpation; no rigidity, guarding, rebound tenderness; laparoscopic incision sites healing well with overlying Dermabond  Genitourinary:    Comments: Right groin without ecchymosis, tenderness, or edema Neurological:     Mental Status: He is alert.    Laboratory studies: None   Imaging:  None   Assessment:  66 y.o. yo Male who presents for follow-up status post robotic assisted laparoscopic right inguinal hernia repair with mesh on 11/16.  Plan:  -Patient is doing well from a surgical standpoint.  I explained that the pain will continue to improve the farther out we get from surgery -Advised him that he can use antibiotic ointment prior to showering to help loosen up the skin glue that is still stuck on his skin -He needs to continue with no heavy lifting for the next 2 to 4 weeks -Our office will fill out his FMLA paperwork, and mail him the originals once they have been faxed to his employer -Follow up as needed  All of the above recommendations were discussed with the patient and  all of patient's questions were answered to his expressed satisfaction.  Graciella Freer, DO [facility] [address] [place], [place] [address] [phone] (office)

## 2022-06-27 NOTE — Telephone Encounter (Signed)
STD papework completed and faxed to [employer] at [phone]. Received confirmation.  Patient requested copy be mailed to his home. Mailed copy.

## 2022-07-07 ENCOUNTER — Encounter: Payer: Self-pay | Admitting: Family

## 2022-07-07 ENCOUNTER — Ambulatory Visit (INDEPENDENT_AMBULATORY_CARE_PROVIDER_SITE_OTHER): Payer: BC Managed Care – PPO | Admitting: Family

## 2022-07-07 VITALS — BP 148/75 | HR 83 | Temp 97.7°F | Ht 72.0 in | Wt 172.0 lb

## 2022-07-07 DIAGNOSIS — Z0000 Encounter for general adult medical examination without abnormal findings: Secondary | ICD-10-CM

## 2022-07-07 DIAGNOSIS — J441 Chronic obstructive pulmonary disease with (acute) exacerbation: Secondary | ICD-10-CM | POA: Diagnosis not present

## 2022-07-07 DIAGNOSIS — Z0001 Encounter for general adult medical examination with abnormal findings: Secondary | ICD-10-CM | POA: Diagnosis not present

## 2022-07-07 DIAGNOSIS — M170 Bilateral primary osteoarthritis of knee: Secondary | ICD-10-CM | POA: Diagnosis not present

## 2022-07-07 DIAGNOSIS — I700 Atherosclerosis of aorta: Secondary | ICD-10-CM | POA: Diagnosis not present

## 2022-07-07 DIAGNOSIS — Z720 Tobacco use: Secondary | ICD-10-CM

## 2022-07-07 DIAGNOSIS — N529 Male erectile dysfunction, unspecified: Secondary | ICD-10-CM

## 2022-07-07 DIAGNOSIS — F1029 Alcohol dependence with unspecified alcohol-induced disorder: Secondary | ICD-10-CM

## 2022-07-07 DIAGNOSIS — J449 Chronic obstructive pulmonary disease, unspecified: Secondary | ICD-10-CM | POA: Diagnosis not present

## 2022-07-07 DIAGNOSIS — I10 Essential (primary) hypertension: Secondary | ICD-10-CM

## 2022-07-07 DIAGNOSIS — Z125 Encounter for screening for malignant neoplasm of prostate: Secondary | ICD-10-CM | POA: Diagnosis not present

## 2022-07-07 MED ORDER — HYDROCHLOROTHIAZIDE 12.5 MG PO TABS: 12.5000 mg | ORAL_TABLET | Freq: Every day | ORAL | 3 refills | Status: DC

## 2022-07-07 MED ORDER — ATORVASTATIN CALCIUM 20 MG PO TABS: 20.0000 mg | ORAL_TABLET | Freq: Every day | ORAL | 3 refills | Status: DC

## 2022-07-07 MED ORDER — IBUPROFEN 800 MG PO TABS: 800.0000 mg | ORAL_TABLET | Freq: Three times a day (TID) | ORAL | 2 refills | Status: DC | PRN

## 2022-07-07 MED ORDER — DOXYCYCLINE HYCLATE 100 MG PO TABS: 100.0000 mg | ORAL_TABLET | Freq: Two times a day (BID) | ORAL | 0 refills | Status: DC

## 2022-07-07 MED ORDER — TRIAMCINOLONE ACETONIDE 0.1 % EX CREA: 1.0000 | TOPICAL_CREAM | Freq: Every day | CUTANEOUS | 2 refills | Status: DC | PRN

## 2022-07-07 NOTE — Patient Instructions (Signed)
Hypertension, Adult High blood pressure (hypertension) is when the force of blood pumping through the arteries is too strong. The arteries are the blood vessels that carry blood from the heart throughout the body. Hypertension forces the heart to work harder to pump blood and may cause arteries to become narrow or stiff. Untreated or uncontrolled hypertension can lead to a heart attack, heart failure, a stroke, kidney disease, and other problems. A blood pressure reading consists of a higher number over a lower number. Ideally, your blood pressure should be below 120/80. The first ("top") number is called the systolic pressure. It is a measure of the pressure in your arteries as your heart beats. The second ("bottom") number is called the diastolic pressure. It is a measure of the pressure in your arteries as the heart relaxes. What are the causes? The exact cause of this condition is not known. There are some conditions that result in high blood pressure. What increases the risk? Certain factors may make you more likely to develop high blood pressure. Some of these risk factors are under your control, including: Smoking. Not getting enough exercise or physical activity. Being overweight. Having too much fat, sugar, calories, or salt (sodium) in your diet. Drinking too much alcohol. Other risk factors include: Having a personal history of heart disease, diabetes, high cholesterol, or kidney disease. Stress. Having a family history of high blood pressure and high cholesterol. Having obstructive sleep apnea. Age. The risk increases with age. What are the signs or symptoms? High blood pressure may not cause symptoms. Very high blood pressure (hypertensive crisis) may cause: Headache. Fast or irregular heartbeats (palpitations). Shortness of breath. Nosebleed. Nausea and vomiting. Vision changes. Severe chest pain, dizziness, and seizures. How is this diagnosed? This condition is diagnosed by  measuring your blood pressure while you are seated, with your arm resting on a flat surface, your legs uncrossed, and your feet flat on the floor. The cuff of the blood pressure monitor will be placed directly against the skin of your upper arm at the level of your heart. Blood pressure should be measured at least twice using the same arm. Certain conditions can cause a difference in blood pressure between your right and left arms. If you have a high blood pressure reading during one visit or you have normal blood pressure with other risk factors, you may be asked to: Return on a different day to have your blood pressure checked again. Monitor your blood pressure at home for 1 week or longer. If you are diagnosed with hypertension, you may have other blood or imaging tests to help your health care provider understand your overall risk for other conditions. How is this treated? This condition is treated by making healthy lifestyle changes, such as eating healthy foods, exercising more, and reducing your alcohol intake. You may be referred for counseling on a healthy diet and physical activity. Your health care provider may prescribe medicine if lifestyle changes are not enough to get your blood pressure under control and if: Your systolic blood pressure is above 130. Your diastolic blood pressure is above 80. Your personal target blood pressure may vary depending on your medical conditions, your age, and other factors. Follow these instructions at home: Eating and drinking  Eat a diet that is high in fiber and potassium, and low in sodium, added sugar, and fat. An example of this eating plan is called the DASH diet. DASH stands for Dietary Approaches to Stop Hypertension. To eat this way: Eat   plenty of fresh fruits and vegetables. Try to fill one half of your plate at each meal with fruits and vegetables. Eat whole grains, such as whole-wheat pasta, brown rice, or whole-grain bread. Fill about one  fourth of your plate with whole grains. Eat or drink low-fat dairy products, such as skim milk or low-fat yogurt. Avoid fatty cuts of meat, processed or cured meats, and poultry with skin. Fill about one fourth of your plate with lean proteins, such as fish, chicken without skin, beans, eggs, or tofu. Avoid pre-made and processed foods. These tend to be higher in sodium, added sugar, and fat. Reduce your daily sodium intake. Many people with hypertension should eat less than 1,500 mg of sodium a day. Do not drink alcohol if: Your health care provider tells you not to drink. You are pregnant, may be pregnant, or are planning to become pregnant. If you drink alcohol: Limit how much you have to: 0-1 drink a day for women. 0-2 drinks a day for men. Know how much alcohol is in your drink. In the [place] one drink equals one 12 oz bottle of beer (355 mL), one 5 oz glass of wine (148 mL), or one 1 oz glass of hard liquor (44 mL). Lifestyle  Work with your health care provider to maintain a healthy body weight or to lose weight. Ask what an ideal weight is for you. Get at least 30 minutes of exercise that causes your heart to beat faster (aerobic exercise) most days of the week. Activities may include walking, swimming, or biking. Include exercise to strengthen your muscles (resistance exercise), such as Pilates or lifting weights, as part of your weekly exercise routine. Try to do these types of exercises for 30 minutes at least 3 days a week. Do not use any products that contain nicotine or tobacco. These products include cigarettes, chewing tobacco, and vaping devices, such as e-cigarettes. If you need help quitting, ask your health care provider. Monitor your blood pressure at home as told by your health care provider. Keep all follow-up visits. This is important. Medicines Take over-the-counter and prescription medicines only as told by your health care provider. Follow directions carefully. Blood  pressure medicines must be taken as prescribed. Do not skip doses of blood pressure medicine. Doing this puts you at risk for problems and can make the medicine less effective. Ask your health care provider about side effects or reactions to medicines that you should watch for. Contact a health care provider if you: Think you are having a reaction to a medicine you are taking. Have headaches that keep coming back (recurring). Feel dizzy. Have swelling in your ankles. Have trouble with your vision. Get help right away if you: Develop a severe headache or confusion. Have unusual weakness or numbness. Feel faint. Have severe pain in your chest or abdomen. Vomit repeatedly. Have trouble breathing. These symptoms may be an emergency. Get help right away. Call 911. Do not wait to see if the symptoms will go away. Do not drive yourself to the hospital. Summary Hypertension is when the force of blood pumping through your arteries is too strong. If this condition is not controlled, it may put you at risk for serious complications. Your personal target blood pressure may vary depending on your medical conditions, your age, and other factors. For most people, a normal blood pressure is less than 120/80. Hypertension is treated with lifestyle changes, medicines, or a combination of both. Lifestyle changes include losing weight, eating a healthy,   low-sodium diet, exercising more, and limiting alcohol. This information is not intended to replace advice given to you by your health care provider. Make sure you discuss any questions you have with your health care provider. Document Revised: 05/21/2021 Document Reviewed: 05/21/2021 Elsevier Patient Education  2023 [employer]  

## 2022-07-07 NOTE — Progress Notes (Signed)
Subjective:    Patient ID: Justin Gill, male    DOB: [birth year], 66 y.o.   MRN: [id number]  Chief Complaint  Patient presents with   Medical Management of Chronic Issues   PT presents to the office today for CPE and chronic follow up.    He is a current smoker for 46 years and currently smoke 1/2 pack a day. He also drinks two 40 oz of beer a day.    He has COPD and states this is stable. He is currently taking Symbicort BID as needed.   He has aortic atherosclerosis and has been prescribed Lipitor daily, but has not been taking regularly.    He has ED and takes Viagra as needed. States this was working well, until we increased his BP medications on last visit. He has went back to work and has had increased stress.  He had a right hernia repair on 06/12/22 and doing well. Hypertension This is a chronic problem. The current episode started more than 1 year ago. The problem has been waxing and waning since onset. The problem is controlled. Associated symptoms include malaise/fatigue. Pertinent negatives include no peripheral edema or shortness of breath. Risk factors for coronary artery disease include dyslipidemia, male gender and sedentary lifestyle. The current treatment provides moderate improvement.  Hyperlipidemia This is a chronic problem. The current episode started more than 1 year ago. The problem is controlled. Recent lipid tests were reviewed and are normal. Associated symptoms include myalgias. Pertinent negatives include no shortness of breath. Current antihyperlipidemic treatment includes statins. The current treatment provides moderate improvement of lipids. Risk factors for coronary artery disease include dyslipidemia, hypertension, a sedentary lifestyle and post-menopausal.  Constipation This is a chronic problem. The current episode started more than 1 year ago. His stool frequency is 1 time per day. Pertinent negatives include no fever. He has tried stool softeners for the  symptoms. The treatment provided moderate relief.  Nicotine Dependence Presents for follow-up visit. His urge triggers include company of smokers. The symptoms have been stable. He smokes < 1/2 a pack of cigarettes per day.  Cough This is a new problem. The current episode started 1 to 4 weeks ago. The problem has been gradually worsening. The problem occurs every few minutes. The cough is Productive of sputum. Associated symptoms include myalgias, nasal congestion and wheezing. Pertinent negatives include no chills, ear congestion, ear pain, fever or shortness of breath. Risk factors for lung disease include smoking/tobacco exposure.  Arthritis Presents for follow-up visit. He complains of pain, stiffness and joint swelling. The symptoms have been worsening. Affected locations include the left knee and right knee. Pertinent negatives include no fever.      Review of Systems  Constitutional:  Positive for malaise/fatigue. Negative for chills and fever.  HENT:  Negative for ear pain.   Respiratory:  Positive for cough and wheezing. Negative for shortness of breath.   Gastrointestinal:  Positive for constipation.  Musculoskeletal:  Positive for arthritis, joint swelling, myalgias and stiffness.  All other systems reviewed and are negative.      Family History  Problem Relation Age of Onset   Diabetes Mother    Heart disease Mother    Hyperlipidemia Mother    Hypertension Mother    Colon cancer Neg Hx    Colon polyps Neg Hx    Social History   Socioeconomic History   Marital status: Divorced    Spouse name: Not on file   Number of children: Not on  file   Years of education: Not on file   Highest education level: Not on file  Occupational History   Not on file  Tobacco Use   Smoking status: Every Day    Packs/day: 0.25    Years: 20.00    Total pack years: 5.00    Types: Cigarettes   Smokeless tobacco: Never  Vaping Use   Vaping Use: Never used  Substance and Sexual  Activity   Alcohol use: Yes    Comment: 40 ounces per day   Drug use: No   Sexual activity: Not on file  Other Topics Concern   Not on file  Social History Narrative   Not on file   Social Determinants of Health   Financial Resource Strain: Medium Risk (12/18/2020)   Overall Financial Resource Strain (CARDIA)    Difficulty of Paying Living Expenses: Somewhat hard  Food Insecurity: No Food Insecurity (12/18/2020)   Hunger Vital Sign    Worried About Running Out of Food in the Last Year: Never true    Ran Out of Food in the Last Year: Never true  Transportation Needs: No Transportation Needs (09/20/2020)   PRAPARE - Hydrologist (Medical): No    Lack of Transportation (Non-Medical): No  Physical Activity: Insufficiently Active (07/25/2021)   Exercise Vital Sign    Days of Exercise per Week: 1 day    Minutes of Exercise per Session: 10 min  Stress: Stress Concern Present (07/25/2021)   [place]    Feeling of Stress : To some extent  Social Connections: Moderately Isolated (09/20/2020)   Social Connection and Isolation Panel [NHANES]    Frequency of Communication with Friends and Family: More than three times a week    Frequency of Social Gatherings with Friends and Family: More than three times a week    Attends Religious Services: More than 4 times per year    Active Member of [employer] or Organizations: No    Attends Archivist Meetings: Never    Marital Status: Divorced    Objective:   Physical Exam Vitals reviewed.  Constitutional:      General: He is not in acute distress.    Appearance: He is well-developed.  HENT:     Head: Normocephalic.     Right Ear: Tympanic membrane normal.     Left Ear: Tympanic membrane normal.  Eyes:     General:        Right eye: No discharge.        Left eye: No discharge.     Pupils: Pupils are equal, round, and reactive to light.   Neck:     Thyroid: No thyromegaly.  Cardiovascular:     Rate and Rhythm: Normal rate and regular rhythm.     Heart sounds: Normal heart sounds. No murmur heard. Pulmonary:     Effort: Pulmonary effort is normal. No respiratory distress.     Breath sounds: Rhonchi present. No wheezing.  Abdominal:     General: Bowel sounds are normal. There is no distension.     Palpations: Abdomen is soft.     Tenderness: There is no abdominal tenderness.  Musculoskeletal:        General: No tenderness. Normal range of motion.     Cervical back: Normal range of motion and neck supple.  Skin:    General: Skin is warm and dry.     Findings: No erythema or rash.  Neurological:     Mental Status: He is alert and oriented to person, place, and time.     Cranial Nerves: No cranial nerve deficit.     Deep Tendon Reflexes: Reflexes are normal and symmetric.  Psychiatric:        Behavior: Behavior normal.        Thought Content: Thought content normal.        Judgment: Judgment normal.       BP (!) 148/75   Pulse 83   Temp 97.7 F (36.5 C) (Temporal)   Ht 6' (1.829 m)   Wt 172 lb (78 kg)   BMI 23.33 kg/m      Assessment & Plan:  Justin Gill comes in today with chief complaint of Medical Management of Chronic Issues   Diagnosis and orders addressed:  1. Aortic atherosclerosis (HCC) - atorvastatin (LIPITOR) 20 MG tablet; Take 1 tablet (20 mg total) by mouth daily.  Dispense: 90 tablet; Refill: 3 - CMP14+EGFR - CBC with Differential/Platelet  2. Tobacco abuse - CMP14+EGFR - CBC with Differential/Platelet  3. Primary hypertension Start HCTZ 12.5 mg  -Daily blood pressure log given with instructions on how to fill out and told to bring to next visit -Dash diet information given -Exercise encouraged - Stress Management  -Continue current meds -RTO in 2 weeks  - hydrochlorothiazide (HYDRODIURIL) 12.5 MG tablet; Take 1 tablet (12.5 mg total) by mouth daily.  Dispense: 90 tablet;  Refill: 3 - CMP14+EGFR - CBC with Differential/Platelet  4. COPD, moderate ([place]) - CMP14+EGFR - CBC with Differential/Platelet  5. Erectile dysfunction, unspecified erectile dysfunction type - CMP14+EGFR - CBC with Differential/Platelet  6. Alcohol dependence with unspecified alcohol-induced disorder (HCC) - CMP14+EGFR - CBC with Differential/Platelet  7. Primary osteoarthritis of both knees - ibuprofen (ADVIL) 800 MG tablet; Take 1 tablet (800 mg total) by mouth every 8 (eight) hours as needed.  Dispense: 30 tablet; Refill: 2 - CMP14+EGFR - CBC with Differential/Platelet  8. Annual physical exam - CMP14+EGFR - CBC with Differential/Platelet - Lipid panel - PSA, total and free - TSH  9. COPD exacerbation ([place]) Start doxycyline  - Take meds as prescribed - Use a cool mist humidifier  -Use saline nose sprays frequently -Force fluids -For any cough or congestion  Use plain Mucinex- regular strength or max strength is fine -For fever or aces or pains- take tylenol or ibuprofen. -Throat lozenges if help - doxycycline (VIBRA-TABS) 100 MG tablet; Take 1 tablet (100 mg total) by mouth 2 (two) times daily.  Dispense: 20 tablet; Refill: 0   Labs pending Health Maintenance reviewed Diet and exercise encouraged  Follow up plan: 2 weeks to recheck HTN  Evelina Dun, FNP

## 2022-07-08 LAB — TSH: TSH: 1.38 u[IU]/mL (ref 0.450–4.500)

## 2022-07-08 LAB — CBC WITH DIFFERENTIAL/PLATELET
Basophils Absolute: 0.1 10*3/uL (ref 0.0–0.2)
Basos: 1 %
EOS (ABSOLUTE): 0.3 10*3/uL (ref 0.0–0.4)
Eos: 6 %
Hematocrit: 43.3 % (ref 37.5–51.0)
Hemoglobin: 15 g/dL (ref 13.0–17.7)
Immature Grans (Abs): 0 10*3/uL (ref 0.0–0.1)
Immature Granulocytes: 0 %
Lymphocytes Absolute: 1.4 10*3/uL (ref 0.7–3.1)
Lymphs: 28 %
MCH: 30.9 pg (ref 26.6–33.0)
MCHC: 34.6 g/dL (ref 31.5–35.7)
MCV: 89 fL (ref 79–97)
Monocytes Absolute: 0.5 10*3/uL (ref 0.1–0.9)
Monocytes: 11 %
Neutrophils Absolute: 2.7 10*3/uL (ref 1.4–7.0)
Neutrophils: 54 %
Platelets: 299 10*3/uL (ref 150–450)
RBC: 4.85 x10E6/uL (ref 4.14–5.80)
RDW: 12.7 % (ref 11.6–15.4)
WBC: 5 10*3/uL (ref 3.4–10.8)

## 2022-07-08 LAB — CMP14+EGFR
ALT: 20 IU/L (ref 0–44)
AST: 30 IU/L (ref 0–40)
Albumin/Globulin Ratio: 2 (ref 1.2–2.2)
Albumin: 4.5 g/dL (ref 3.9–4.9)
Alkaline Phosphatase: 85 IU/L (ref 44–121)
BUN/Creatinine Ratio: 13 (ref 10–24)
BUN: 11 mg/dL (ref 8–27)
Bilirubin Total: 2 mg/dL — ABNORMAL HIGH (ref 0.0–1.2)
CO2: 24 mmol/L (ref 20–29)
Calcium: 9.4 mg/dL (ref 8.6–10.2)
Chloride: 102 mmol/L (ref 96–106)
Creatinine, Ser: 0.88 mg/dL (ref 0.76–1.27)
Globulin, Total: 2.2 g/dL (ref 1.5–4.5)
Glucose: 105 mg/dL — ABNORMAL HIGH (ref 70–99)
Potassium: 4.2 mmol/L (ref 3.5–5.2)
Sodium: 140 mmol/L (ref 134–144)
Total Protein: 6.7 g/dL (ref 6.0–8.5)
eGFR: 94 mL/min/{1.73_m2} (ref >59)

## 2022-07-08 LAB — LIPID PANEL
Chol/HDL Ratio: 1.9 ratio (ref 0.0–5.0)
Cholesterol, Total: 148 mg/dL (ref 100–199)
HDL: 77 mg/dL (ref >39)
LDL Chol Calc (NIH): 57 mg/dL (ref 0–99)
Triglycerides: 68 mg/dL (ref 0–149)
VLDL Cholesterol Cal: 14 mg/dL (ref 5–40)

## 2022-07-08 LAB — PSA, TOTAL AND FREE
PSA, Free Pct: 22.5 %
PSA, Free: 0.27 ng/mL
Prostate Specific Ag, Serum: 1.2 ng/mL (ref 0.0–4.0)

## 2022-07-12 ENCOUNTER — Other Ambulatory Visit: Payer: Self-pay | Admitting: Family

## 2022-07-12 DIAGNOSIS — J449 Chronic obstructive pulmonary disease, unspecified: Secondary | ICD-10-CM

## 2022-08-07 ENCOUNTER — Encounter: Payer: Self-pay | Admitting: Family

## 2022-08-07 ENCOUNTER — Ambulatory Visit (INDEPENDENT_AMBULATORY_CARE_PROVIDER_SITE_OTHER): Payer: BC Managed Care – PPO | Admitting: Family

## 2022-08-07 VITALS — BP 128/82 | HR 72 | Temp 97.1°F | Ht 72.0 in | Wt 170.0 lb

## 2022-08-07 DIAGNOSIS — Z8719 Personal history of other diseases of the digestive system: Secondary | ICD-10-CM | POA: Diagnosis not present

## 2022-08-07 DIAGNOSIS — F10281 Alcohol dependence with alcohol-induced sexual dysfunction: Secondary | ICD-10-CM

## 2022-08-07 DIAGNOSIS — I10 Essential (primary) hypertension: Secondary | ICD-10-CM

## 2022-08-07 DIAGNOSIS — Z9889 Other specified postprocedural states: Secondary | ICD-10-CM

## 2022-08-07 DIAGNOSIS — F17200 Nicotine dependence, unspecified, uncomplicated: Secondary | ICD-10-CM

## 2022-08-07 DIAGNOSIS — F17210 Nicotine dependence, cigarettes, uncomplicated: Secondary | ICD-10-CM

## 2022-08-07 NOTE — Progress Notes (Signed)
Subjective:    Patient ID: Justin Gill, male    DOB: 04-03-1956, 67 y.o.   MRN: [id number]  Chief Complaint  Patient presents with   Hypertension   PT presents to the office today to recheck HTN. He was seen on 07/07/22 and started on HCTZ 12.5 mg. His BP is at goal today!   He had right hernia repair on 06/12/22 and just noticed a "bump" in his right groin that he is bulging.   He also reports he had cut back his smoking to 2 cigarettes a day and drinking 20 oz beer a day.  Hypertension This is a chronic problem. The current episode started more than 1 year ago. The problem has been resolved since onset. The problem is controlled. Pertinent negatives include no malaise/fatigue, peripheral edema or shortness of breath. Risk factors for coronary artery disease include dyslipidemia. The current treatment provides moderate improvement. There is no history of heart failure.  Nicotine Dependence Presents for follow-up visit. His urge triggers include company of smokers. The symptoms have been stable. He smokes < 1/2 a pack of cigarettes per day.      Review of Systems  Constitutional:  Negative for malaise/fatigue.  Respiratory:  Negative for shortness of breath.   All other systems reviewed and are negative.      Objective:   Physical Exam Vitals reviewed.  Constitutional:      General: He is not in acute distress.    Appearance: He is well-developed.  HENT:     Head: Normocephalic.     Right Ear: Tympanic membrane normal.     Left Ear: Tympanic membrane normal.  Eyes:     General:        Right eye: No discharge.        Left eye: No discharge.     Pupils: Pupils are equal, round, and reactive to light.  Neck:     Thyroid: No thyromegaly.  Cardiovascular:     Rate and Rhythm: Normal rate and regular rhythm.     Heart sounds: Normal heart sounds. No murmur heard. Pulmonary:     Effort: Pulmonary effort is normal. No respiratory distress.     Breath sounds: Normal breath  sounds. No wheezing.  Abdominal:     General: Bowel sounds are normal. There is no distension.     Palpations: Abdomen is soft.     Tenderness: There is no abdominal tenderness.     Hernia: Hernia: no hernia noted, but small bulge felt in right groin.  Musculoskeletal:        General: No tenderness. Normal range of motion.     Cervical back: Normal range of motion and neck supple.  Skin:    General: Skin is warm and dry.     Findings: No erythema or rash.  Neurological:     Mental Status: He is alert and oriented to person, place, and time.     Cranial Nerves: No cranial nerve deficit.     Deep Tendon Reflexes: Reflexes are normal and symmetric.  Psychiatric:        Behavior: Behavior normal.        Thought Content: Thought content normal.        Judgment: Judgment normal.       BP 128/82   Pulse 72   Temp (!) 97.1 F (36.2 C) (Temporal)   Ht 6' (1.829 m)   Wt 170 lb (77.1 kg)   SpO2 98%   BMI 23.06 kg/m  Assessment & Plan:  [name] comes in today with chief complaint of Hypertension   Diagnosis and orders addressed:  1. Primary hypertension At goal today -Exercise encouraged - Stress Management  -Continue current meds - CMP14+EGFR  2. Alcohol dependence with alcohol-induced sexual dysfunction ([place]) Continue to decrease  - CMP14+EGFR  3. H/O right inguinal hernia repair Call surgeon today about new area Pt wants to hold off on [place] at this time and discuss with surgeon first - CMP14+EGFR  4. Current smoker Continue to decrease   Labs pending Health Maintenance reviewed Diet and exercise encouraged  Follow up plan: 6 months    Evelina Dun, FNP

## 2022-08-07 NOTE — Patient Instructions (Signed)
Hypertension, Adult High blood pressure (hypertension) is when the force of blood pumping through the arteries is too strong. The arteries are the blood vessels that carry blood from the heart throughout the body. Hypertension forces the heart to work harder to pump blood and may cause arteries to become narrow or stiff. Untreated or uncontrolled hypertension can lead to a heart attack, heart failure, a stroke, kidney disease, and other problems. A blood pressure reading consists of a higher number over a lower number. Ideally, your blood pressure should be below 120/80. The first ("top") number is called the systolic pressure. It is a measure of the pressure in your arteries as your heart beats. The second ("bottom") number is called the diastolic pressure. It is a measure of the pressure in your arteries as the heart relaxes. What are the causes? The exact cause of this condition is not known. There are some conditions that result in high blood pressure. What increases the risk? Certain factors may make you more likely to develop high blood pressure. Some of these risk factors are under your control, including: Smoking. Not getting enough exercise or physical activity. Being overweight. Having too much fat, sugar, calories, or salt (sodium) in your diet. Drinking too much alcohol. Other risk factors include: Having a personal history of heart disease, diabetes, high cholesterol, or kidney disease. Stress. Having a family history of high blood pressure and high cholesterol. Having obstructive sleep apnea. Age. The risk increases with age. What are the signs or symptoms? High blood pressure may not cause symptoms. Very high blood pressure (hypertensive crisis) may cause: Headache. Fast or irregular heartbeats (palpitations). Shortness of breath. Nosebleed. Nausea and vomiting. Vision changes. Severe chest pain, dizziness, and seizures. How is this diagnosed? This condition is diagnosed by  measuring your blood pressure while you are seated, with your arm resting on a flat surface, your legs uncrossed, and your feet flat on the floor. The cuff of the blood pressure monitor will be placed directly against the skin of your upper arm at the level of your heart. Blood pressure should be measured at least twice using the same arm. Certain conditions can cause a difference in blood pressure between your right and left arms. If you have a high blood pressure reading during one visit or you have normal blood pressure with other risk factors, you may be asked to: Return on a different day to have your blood pressure checked again. Monitor your blood pressure at home for 1 week or longer. If you are diagnosed with hypertension, you may have other blood or imaging tests to help your health care provider understand your overall risk for other conditions. How is this treated? This condition is treated by making healthy lifestyle changes, such as eating healthy foods, exercising more, and reducing your alcohol intake. You may be referred for counseling on a healthy diet and physical activity. Your health care provider may prescribe medicine if lifestyle changes are not enough to get your blood pressure under control and if: Your systolic blood pressure is above 130. Your diastolic blood pressure is above 80. Your personal target blood pressure may vary depending on your medical conditions, your age, and other factors. Follow these instructions at home: Eating and drinking  Eat a diet that is high in fiber and potassium, and low in sodium, added sugar, and fat. An example of this eating plan is called the DASH diet. DASH stands for Dietary Approaches to Stop Hypertension. To eat this way: Eat   plenty of fresh fruits and vegetables. Try to fill one half of your plate at each meal with fruits and vegetables. Eat whole grains, such as whole-wheat pasta, brown rice, or whole-grain bread. Fill about one  fourth of your plate with whole grains. Eat or drink low-fat dairy products, such as skim milk or low-fat yogurt. Avoid fatty cuts of meat, processed or cured meats, and poultry with skin. Fill about one fourth of your plate with lean proteins, such as fish, chicken without skin, beans, eggs, or tofu. Avoid pre-made and processed foods. These tend to be higher in sodium, added sugar, and fat. Reduce your daily sodium intake. Many people with hypertension should eat less than 1,500 mg of sodium a day. Do not drink alcohol if: Your health care provider tells you not to drink. You are pregnant, may be pregnant, or are planning to become pregnant. If you drink alcohol: Limit how much you have to: 0-1 drink a day for women. 0-2 drinks a day for men. Know how much alcohol is in your drink. In the [place] one drink equals one 12 oz bottle of beer (355 mL), one 5 oz glass of wine (148 mL), or one 1 oz glass of hard liquor (44 mL). Lifestyle  Work with your health care provider to maintain a healthy body weight or to lose weight. Ask what an ideal weight is for you. Get at least 30 minutes of exercise that causes your heart to beat faster (aerobic exercise) most days of the week. Activities may include walking, swimming, or biking. Include exercise to strengthen your muscles (resistance exercise), such as Pilates or lifting weights, as part of your weekly exercise routine. Try to do these types of exercises for 30 minutes at least 3 days a week. Do not use any products that contain nicotine or tobacco. These products include cigarettes, chewing tobacco, and vaping devices, such as e-cigarettes. If you need help quitting, ask your health care provider. Monitor your blood pressure at home as told by your health care provider. Keep all follow-up visits. This is important. Medicines Take over-the-counter and prescription medicines only as told by your health care provider. Follow directions carefully. Blood  pressure medicines must be taken as prescribed. Do not skip doses of blood pressure medicine. Doing this puts you at risk for problems and can make the medicine less effective. Ask your health care provider about side effects or reactions to medicines that you should watch for. Contact a health care provider if you: Think you are having a reaction to a medicine you are taking. Have headaches that keep coming back (recurring). Feel dizzy. Have swelling in your ankles. Have trouble with your vision. Get help right away if you: Develop a severe headache or confusion. Have unusual weakness or numbness. Feel faint. Have severe pain in your chest or abdomen. Vomit repeatedly. Have trouble breathing. These symptoms may be an emergency. Get help right away. Call 911. Do not wait to see if the symptoms will go away. Do not drive yourself to the hospital. Summary Hypertension is when the force of blood pumping through your arteries is too strong. If this condition is not controlled, it may put you at risk for serious complications. Your personal target blood pressure may vary depending on your medical conditions, your age, and other factors. For most people, a normal blood pressure is less than 120/80. Hypertension is treated with lifestyle changes, medicines, or a combination of both. Lifestyle changes include losing weight, eating a healthy,   low-sodium diet, exercising more, and limiting alcohol. This information is not intended to replace advice given to you by your health care provider. Make sure you discuss any questions you have with your health care provider. Document Revised: 05/21/2021 Document Reviewed: 05/21/2021 Elsevier Patient Education  2023 [employer]  

## 2022-08-08 LAB — CMP14+EGFR
ALT: 23 IU/L (ref 0–44)
AST: 21 IU/L (ref 0–40)
Albumin/Globulin Ratio: 2 (ref 1.2–2.2)
Albumin: 4.5 g/dL (ref 3.9–4.9)
Alkaline Phosphatase: 65 IU/L (ref 44–121)
BUN/Creatinine Ratio: 13 (ref 10–24)
BUN: 12 mg/dL (ref 8–27)
Bilirubin Total: 0.6 mg/dL (ref 0.0–1.2)
CO2: 21 mmol/L (ref 20–29)
Calcium: 9.2 mg/dL (ref 8.6–10.2)
Chloride: 104 mmol/L (ref 96–106)
Creatinine, Ser: 0.94 mg/dL (ref 0.76–1.27)
Globulin, Total: 2.3 g/dL (ref 1.5–4.5)
Glucose: 99 mg/dL (ref 70–99)
Potassium: 4 mmol/L (ref 3.5–5.2)
Sodium: 139 mmol/L (ref 134–144)
Total Protein: 6.8 g/dL (ref 6.0–8.5)
eGFR: 89 mL/min/{1.73_m2} (ref >59)

## 2022-09-10 ENCOUNTER — Other Ambulatory Visit: Payer: Self-pay | Admitting: Family

## 2022-09-10 DIAGNOSIS — I10 Essential (primary) hypertension: Secondary | ICD-10-CM

## 2022-11-10 ENCOUNTER — Encounter: Payer: Self-pay | Admitting: Nurse Practitioner

## 2022-11-10 ENCOUNTER — Ambulatory Visit (INDEPENDENT_AMBULATORY_CARE_PROVIDER_SITE_OTHER): Payer: BC Managed Care – PPO | Admitting: Nurse Practitioner

## 2022-11-10 VITALS — BP 152/89 | HR 71 | Temp 97.7°F | Resp 20 | Ht 72.0 in | Wt 168.0 lb

## 2022-11-10 DIAGNOSIS — R0981 Nasal congestion: Secondary | ICD-10-CM | POA: Diagnosis not present

## 2022-11-10 DIAGNOSIS — J209 Acute bronchitis, unspecified: Secondary | ICD-10-CM

## 2022-11-10 DIAGNOSIS — J440 Chronic obstructive pulmonary disease with acute lower respiratory infection: Secondary | ICD-10-CM

## 2022-11-10 MED ORDER — BENZONATATE 100 MG PO CAPS: 100.0000 mg | ORAL_CAPSULE | Freq: Three times a day (TID) | ORAL | 0 refills | Status: DC | PRN

## 2022-11-10 MED ORDER — AZITHROMYCIN 250 MG PO TABS: ORAL_TABLET | ORAL | 0 refills | Status: DC

## 2022-11-10 MED ORDER — PREDNISONE 20 MG PO TABS: 40.0000 mg | ORAL_TABLET | Freq: Every day | ORAL | 0 refills | Status: AC

## 2022-11-10 MED ORDER — FLUTICASONE PROPIONATE 50 MCG/ACT NA SUSP: 2.0000 | Freq: Every day | NASAL | 6 refills | Status: DC

## 2022-11-10 NOTE — Patient Instructions (Signed)
Bupropion Sustained-Release Tablets (Smoking Cessation) What is this medication? BUPROPION (byoo PROE pee on) helps you quit smoking. It reduces cravings for nicotine, the addictive substance found in tobacco. It may also help reduce symptoms of withdrawal. It is most effective when used in combination with a stop-smoking program. This medicine may be used for other purposes; ask your health care provider or pharmacist if you have questions. COMMON BRAND NAME(S): Buproban, Zyban What should I tell my care team before I take this medication? They need to know if you have any of these conditions: An eating disorder, such as anorexia or bulimia Bipolar disorder or psychosis Diabetes or high blood sugar, treated with medication Glaucoma Head injury or brain tumor Heart disease, previous heart attack, or irregular heart beat High blood pressure Kidney or liver disease Seizures Suicidal thoughts or a previous suicide attempt Tourette's syndrome Weight loss An unusual or allergic reaction to bupropion, other medications, foods, dyes, or preservatives Pregnant or trying to become pregnant Breast-feeding How should I use this medication? Take this medication by mouth with a glass of water. Follow the directions on the prescription label. You can take it with or without food. If it upsets your stomach, take it with food. Do not cut, crush or chew this medication. Take your medication at regular intervals. If you take this medication more than once a day, take your second dose at least 8 hours after you take your first dose. To limit trouble in sleeping, avoid taking this medication at bedtime. Do not take your medication more often than directed. Do not stop taking this medication suddenly except upon the advice of your care team. Stopping this medication too quickly may cause serious side effects. A special MedGuide will be given to you by the pharmacist with each prescription and refill. Be sure to  read this information carefully each time. Talk to your care team about the use of this medication in children. Special care may be needed. Overdosage: If you think you have taken too much of this medicine contact a poison control center or emergency room at once. NOTE: This medicine is only for you. Do not share this medicine with others. What if I miss a dose? If you miss a dose, skip the missed dose and take your next tablet at the regular time. There should be at least 8 hours between doses. Do not take double or extra doses. What may interact with this medication? Do not take this medication with any of the following: Linezolid MAOIs like Azilect, Carbex, Eldepryl, Marplan, Nardil, and Parnate Methylene blue (injected into a vein) Other medications that contain bupropion like Wellbutrin This medication may also interact with the following: Alcohol Certain medications for anxiety or sleep Certain medications for blood pressure like metoprolol, propranolol Certain medications for depression or psychotic disturbances Certain medications for HIV or AIDS like efavirenz, lopinavir, nelfinavir, ritonavir Certain medications for irregular heart beat like propafenone, flecainide Certain medications for Parkinson's disease like amantadine, levodopa Certain medications for seizures like carbamazepine, phenytoin, phenobarbital Cimetidine Clopidogrel Cyclophosphamide Digoxin Furazolidone Isoniazid Nicotine Orphenadrine Procarbazine Steroid medications like prednisone or cortisone Stimulant medications for attention disorders, weight loss, or to stay awake Tamoxifen Theophylline Thiotepa Ticlopidine Tramadol Warfarin This list may not describe all possible interactions. Give your health care provider a list of all the medicines, herbs, non-prescription drugs, or dietary supplements you use. Also tell them if you smoke, drink alcohol, or use illegal drugs. Some items may interact with  your medicine. What should I   watch for while using this medication? Visit your care team for regular checks on your progress. This medication should be used together with a patient support program. It is important to participate in a behavioral program, counseling, or other support program that is recommended by your care team. Watch for new or worsening thoughts of suicide or depression. This includes sudden changes in mood, behavior, or thoughts. These changes can happen at any time but are more common in the beginning of treatment or after a change in dose. Call your care team right away if you experience these thoughts or worsening depression. Manic episodes may happen in patients with bipolar disorder who take this medication. Watch for changes in feelings or behaviors such as feeling anxious, nervous, agitated, panicky, irritable, hostile, aggressive, impulsive, severely restless, overly excited and hyperactive, or trouble sleeping. These symptoms can happen at anytime but are more common in the beginning of treatment or after a change in dose. Call your care team right away if you notice any of these symptoms. This medication may cause serious skin reactions. They can happen weeks to months after starting the medication. Contact your care team right away if you notice fevers or flu-like symptoms with a rash. The rash may be red or purple and then turn into blisters or peeling of the skin. Or, you might notice a red rash with swelling of the face, lips or lymph nodes in your neck or under your arms. Avoid drinks that contain alcohol while taking this medication. Drinking large amounts of alcohol, using sleeping or anxiety medications, or quickly stopping the use of these agents while taking this medication may increase your risk for a seizure. Do not drive or use heavy machinery until you know how this medication affects you. This medication can impair your ability to perform these tasks. Do not take  this medication close to bedtime. It may prevent you from sleeping. Your mouth may get dry. Chewing sugarless gum or sucking hard candy, and drinking plenty of water may help. Contact your care team if the problem does not go away or is severe. Do not use nicotine patches or chewing gum without the advice of your care team while taking this medication. You may need to have your blood pressure taken regularly if your doctor recommends that you use both nicotine and this medication together. What side effects may I notice from receiving this medication? Side effects that you should report to your care team as soon as possible: Allergic reactions--skin rash, itching, hives, swelling of the face, lips, tongue, or throat Increase in blood pressure Mood and behavior changes--anxiety, nervousness, confusion, hallucinations, irritability, hostility, thoughts of suicide or self-harm, worsening mood, feelings of depression Redness, blistering, peeling, or loosening of the skin, including inside the mouth Seizures Sudden eye pain or change in vision such as blurry vision, seeing halos around lights, vision loss Side effects that usually do not require medical attention (report to your care team if they continue or are bothersome): Constipation Dizziness Dry mouth Loss of appetite Nausea Tremors or shaking Trouble sleeping This list may not describe all possible side effects. Call your doctor for medical advice about side effects. You may report side effects to FDA at 1-800-FDA-1088. Where should I keep my medication? Keep out of the reach of children and pets. Store at room temperature between 20 and 25 degrees C (68 and 77 degrees F). Protect from light. Keep container tightly closed. Throw away any unused medication after the expiration date. NOTE:  This sheet is a summary. It may not cover all possible information. If you have questions about this medicine, talk to your doctor, pharmacist, or health  care provider.  2023 Elsevier/Gold Standard (2007-09-04 00:00:00)

## 2022-11-10 NOTE — Progress Notes (Signed)
Subjective:    Patient ID: Travell Ludeke, male    DOB: Aug 12, 1955, 67 y.o.   MRN: [id number]   Chief Complaint: sinus issues  Sinusitis This is a new problem. The current episode started more than 1 month ago. The problem has been waxing and waning since onset. There has been no fever. The pain is mild. Associated symptoms include congestion, coughing and headaches. Pertinent negatives include no chills or sore throat. Past treatments include acetaminophen, oral decongestants and nasal decongestants. The treatment provided mild relief.    Patient Active Problem List   Diagnosis Date Noted   H/O right inguinal hernia repair 08/07/2022   Right inguinal hernia 06/12/2022   Aortic atherosclerosis 05/09/2022   Renal mass, right 05/09/2022   Atopic dermatitis 12/14/2020   Elevated hemoglobin 12/14/2020   Alcohol dependence 11/30/2020   HTN (hypertension) 04/19/2015   COPD, moderate 04/19/2015   Current smoker 04/19/2015   Erectile dysfunction 04/19/2015       Review of Systems  Constitutional:  Negative for chills and fever.  HENT:  Positive for congestion. Negative for sore throat.   Respiratory:  Positive for cough.   Neurological:  Positive for headaches.       Objective:   Physical Exam Vitals reviewed.  Constitutional:      Appearance: Normal appearance.  HENT:     Right Ear: Tympanic membrane normal.     Left Ear: Tympanic membrane normal.     Nose: Congestion and rhinorrhea present.     Right Sinus: No maxillary sinus tenderness or frontal sinus tenderness.     Left Sinus: No frontal sinus tenderness.     Mouth/Throat:     Pharynx: No oropharyngeal exudate or posterior oropharyngeal erythema.  Eyes:     Pupils: Pupils are equal, round, and reactive to light.  Cardiovascular:     Rate and Rhythm: Normal rate and regular rhythm.  Pulmonary:     Breath sounds: Wheezing (course exp wheezes throughout) present.  Skin:    General: Skin is warm and dry.   Neurological:     General: No focal deficit present.     Mental Status: He is alert.  Psychiatric:        Mood and Affect: Mood normal.        Behavior: Behavior normal.    BP (!) 152/89   Pulse 71   Temp 97.7 F (36.5 C) (Temporal)   Resp 20   Ht 6' (1.829 m)   Wt 168 lb (76.2 kg)   SpO2 94%   BMI 22.78 kg/m         Assessment & Plan:  Sharyl Nimrod in today with chief complaint of No chief complaint on file.   1. Acute bronchitis with COPD 1. Take meds as prescribed 2. Use a cool mist humidifier especially during the winter months and when heat has been humid. 3. Use saline nose sprays frequently 4. Saline irrigations of the nose can be very helpful if done frequently.  * 4X daily for 1 week*  * Use of a nettie pot can be helpful with this. Follow directions with this* 5. Drink plenty of fluids 6. Keep thermostat turn down low 7.For any cough or congestion- tessalon perles 8. For fever or aces or pains- take tylenol or ibuprofen appropriate for age and weight.  * for fevers greater than 101 orally you may alternate ibuprofen and tylenol every  3 hours.  Smoking cessation encouraged  - predniSONE (DELTASONE) 20 MG tablet; Take 2  tablets (40 mg total) by mouth daily with breakfast for 5 days. 2 po daily for 5 days  Dispense: 10 tablet; Refill: 0 - benzonatate (TESSALON PERLES) 100 MG capsule; Take 1 capsule (100 mg total) by mouth 3 (three) times daily as needed for cough.  Dispense: 20 capsule; Refill: 0 - azithromycin (ZITHROMAX Z-PAK) 250 MG tablet; As directed  Dispense: 6 tablet; Refill: 0  2. Nasal congestion Force fuids - fluticasone (FLONASE) 50 MCG/ACT nasal spray; Place 2 sprays into both nostrils daily.  Dispense: 16 g; Refill: 6    The above assessment and management plan was discussed with the patient. The patient verbalized understanding of and has agreed to the management plan. Patient is aware to call the clinic if symptoms persist or worsen. Patient  is aware when to return to the clinic for a follow-up visit. Patient educated on when it is appropriate to go to the emergency department.   Mary-Margaret Daphine Deutscher, FNP

## 2023-02-02 ENCOUNTER — Ambulatory Visit (INDEPENDENT_AMBULATORY_CARE_PROVIDER_SITE_OTHER): Payer: BC Managed Care – PPO | Admitting: Family

## 2023-02-02 ENCOUNTER — Encounter: Payer: Self-pay | Admitting: Family

## 2023-02-02 VITALS — BP 127/82 | HR 78 | Temp 97.8°F | Ht 72.0 in | Wt 165.0 lb

## 2023-02-02 DIAGNOSIS — F17200 Nicotine dependence, unspecified, uncomplicated: Secondary | ICD-10-CM | POA: Diagnosis not present

## 2023-02-02 DIAGNOSIS — F17210 Nicotine dependence, cigarettes, uncomplicated: Secondary | ICD-10-CM | POA: Diagnosis not present

## 2023-02-02 DIAGNOSIS — J449 Chronic obstructive pulmonary disease, unspecified: Secondary | ICD-10-CM

## 2023-02-02 DIAGNOSIS — N529 Male erectile dysfunction, unspecified: Secondary | ICD-10-CM | POA: Diagnosis not present

## 2023-02-02 DIAGNOSIS — I10 Essential (primary) hypertension: Secondary | ICD-10-CM | POA: Diagnosis not present

## 2023-02-02 DIAGNOSIS — M170 Bilateral primary osteoarthritis of knee: Secondary | ICD-10-CM | POA: Diagnosis not present

## 2023-02-02 DIAGNOSIS — I700 Atherosclerosis of aorta: Secondary | ICD-10-CM | POA: Diagnosis not present

## 2023-02-02 DIAGNOSIS — F10281 Alcohol dependence with alcohol-induced sexual dysfunction: Secondary | ICD-10-CM | POA: Diagnosis not present

## 2023-02-02 MED ORDER — ATORVASTATIN CALCIUM 20 MG PO TABS: 20.0000 mg | ORAL_TABLET | Freq: Every day | ORAL | 3 refills | Status: DC

## 2023-02-02 MED ORDER — HYDROCHLOROTHIAZIDE 12.5 MG PO TABS: 12.5000 mg | ORAL_TABLET | Freq: Every day | ORAL | 3 refills | Status: DC

## 2023-02-02 MED ORDER — IBUPROFEN 800 MG PO TABS: 800.0000 mg | ORAL_TABLET | Freq: Three times a day (TID) | ORAL | 2 refills | Status: DC | PRN

## 2023-02-02 MED ORDER — TRIAMCINOLONE ACETONIDE 0.1 % EX CREA: 1.0000 | TOPICAL_CREAM | Freq: Every day | CUTANEOUS | 2 refills | Status: AC | PRN

## 2023-02-02 MED ORDER — DOCUSATE SODIUM 100 MG PO CAPS: 100.0000 mg | ORAL_CAPSULE | Freq: Two times a day (BID) | ORAL | 2 refills | Status: AC

## 2023-02-02 MED ORDER — BUDESONIDE-FORMOTEROL FUMARATE 160-4.5 MCG/ACT IN AERO: INHALATION_SPRAY | RESPIRATORY_TRACT | 3 refills | Status: DC

## 2023-02-02 MED ORDER — AMLODIPINE BESYLATE 10 MG PO TABS: 10.0000 mg | ORAL_TABLET | Freq: Every day | ORAL | 1 refills | Status: DC

## 2023-02-02 MED ORDER — DOXYCYCLINE HYCLATE 100 MG PO TABS: 100.0000 mg | ORAL_TABLET | Freq: Two times a day (BID) | ORAL | 0 refills | Status: DC

## 2023-02-02 MED ORDER — PREDNISONE 10 MG (21) PO TBPK: ORAL_TABLET | ORAL | 0 refills | Status: DC

## 2023-02-02 NOTE — Patient Instructions (Signed)

## 2023-02-02 NOTE — Progress Notes (Signed)
Subjective:    Patient ID: Justin Gill, male    DOB: [birth year], 67 y.o.   MRN: [id number]  Chief Complaint  Patient presents with   Medical Management of Chronic Issues   Sinus Problem    Been going on for months    PT presents to the office today for chronic follow up.    He is a current smoker for 46 years and currently smoke 1/2 pack a day. He also drinks 1-2 40 oz of beer a day.    He has COPD and states this is stable. He is currently taking Symbicort BID as needed.    He has aortic atherosclerosis and has been prescribed Lipitor daily, but has not been taking regularly.    He has ED and takes Viagra as needed. States this was working well, until we increased his BP medications on last visit. He has went back to work and has had increased stress.   He had a right hernia repair on 06/12/22 and doing well. Sinus Problem This is a new problem. The current episode started more than 1 month ago. The problem has been gradually worsening since onset. There has been no fever. His pain is at a severity of 2/10. The pain is moderate. Associated symptoms include congestion, coughing, shortness of breath and sinus pressure. Pertinent negatives include no ear pain, headaches, sneezing or sore throat. Past treatments include oral decongestants, acetaminophen and antibiotics. The treatment provided mild relief.  Hypertension This is a chronic problem. The current episode started more than 1 year ago. The problem has been resolved since onset. The problem is controlled. Associated symptoms include malaise/fatigue, peripheral edema and shortness of breath. Pertinent negatives include no headaches. Risk factors for coronary artery disease include obesity. The current treatment provides moderate improvement.  Nicotine Dependence Presents for follow-up visit. Symptoms are negative for sore throat. His urge triggers include company of smokers. He smokes < 1/2 a pack of cigarettes per day.       Review of Systems  Constitutional:  Positive for malaise/fatigue.  HENT:  Positive for congestion and sinus pressure. Negative for ear pain, sneezing and sore throat.   Respiratory:  Positive for cough and shortness of breath.   Neurological:  Negative for headaches.  All other systems reviewed and are negative.      Objective:   Physical Exam Vitals reviewed.  Constitutional:      General: He is not in acute distress.    Appearance: He is well-developed.  HENT:     Head: Normocephalic.     Right Ear: Tympanic membrane normal.     Left Ear: Tympanic membrane normal.     Nose: Congestion and rhinorrhea present.  Eyes:     General:        Right eye: No discharge.        Left eye: No discharge.     Pupils: Pupils are equal, round, and reactive to light.  Neck:     Thyroid: No thyromegaly.  Cardiovascular:     Rate and Rhythm: Normal rate and regular rhythm.     Heart sounds: Normal heart sounds. No murmur heard. Pulmonary:     Effort: Pulmonary effort is normal. No respiratory distress.     Breath sounds: Rhonchi present. No wheezing.  Abdominal:     General: Bowel sounds are normal. There is no distension.     Palpations: Abdomen is soft.     Tenderness: There is no abdominal tenderness.  Musculoskeletal:  General: No tenderness. Normal range of motion.     Cervical back: Normal range of motion and neck supple.  Skin:    General: Skin is warm and dry.     Findings: No erythema or rash.  Neurological:     Mental Status: He is alert and oriented to person, place, and time.     Cranial Nerves: No cranial nerve deficit.     Deep Tendon Reflexes: Reflexes are normal and symmetric.  Psychiatric:        Behavior: Behavior normal.        Thought Content: Thought content normal.        Judgment: Judgment normal.       BP 127/82   Pulse 78   Temp 97.8 F (36.6 C) (Temporal)   Ht 6' (1.829 m)   Wt 165 lb (74.8 kg)   SpO2 97%   BMI 22.38 kg/m       Assessment & Plan:  Justin Gill comes in today with chief complaint of Medical Management of Chronic Issues and Sinus Problem (Been going on for months )   Diagnosis and orders addressed:  1. Primary hypertension - amLODipine (NORVASC) 10 MG tablet; Take 1 tablet (10 mg total) by mouth daily.  Dispense: 90 tablet; Refill: 1 - hydrochlorothiazide (HYDRODIURIL) 12.5 MG tablet; Take 1 tablet (12.5 mg total) by mouth daily.  Dispense: 90 tablet; Refill: 3 - CMP14+EGFR  2. Alcohol dependence with alcohol-induced sexual dysfunction (HCC) - CMP14+EGFR  3. Erectile dysfunction, unspecified erectile dysfunction type - CMP14+EGFR  4. Current smoker - CMP14+EGFR  5. Aortic atherosclerosis (HCC) - atorvastatin (LIPITOR) 20 MG tablet; Take 1 tablet (20 mg total) by mouth daily.  Dispense: 90 tablet; Refill: 3 - CMP14+EGFR  6. COPD, moderate (HCC) - budesonide-formoterol (SYMBICORT) 160-4.5 MCG/ACT inhaler; INHALE 2 PUFFS INTO THE LUNGS TWICE A DAY  Dispense: 10.2 each; Refill: 3 - CMP14+EGFR - doxycycline (VIBRA-TABS) 100 MG tablet; Take 1 tablet (100 mg total) by mouth 2 (two) times daily.  Dispense: 20 tablet; Refill: 0  7. Primary osteoarthritis of both knees - ibuprofen (ADVIL) 800 MG tablet; Take 1 tablet (800 mg total) by mouth every 8 (eight) hours as needed.  Dispense: 30 tablet; Refill: 2 - CMP14+EGFR   Labs pending Health Maintenance reviewed Diet and exercise encouraged  Follow up plan: 6 months    Jannifer Rodney, FNP

## 2023-02-03 LAB — CMP14+EGFR
ALT: 20 IU/L (ref 0–44)
AST: 17 IU/L (ref 0–40)
Albumin: 4.1 g/dL (ref 3.9–4.9)
Alkaline Phosphatase: 69 IU/L (ref 44–121)
BUN/Creatinine Ratio: 12 (ref 10–24)
BUN: 10 mg/dL (ref 8–27)
Bilirubin Total: 0.6 mg/dL (ref 0.0–1.2)
CO2: 20 mmol/L (ref 20–29)
Calcium: 9.2 mg/dL (ref 8.6–10.2)
Chloride: 106 mmol/L (ref 96–106)
Creatinine, Ser: 0.86 mg/dL (ref 0.76–1.27)
Globulin, Total: 2.5 g/dL (ref 1.5–4.5)
Glucose: 100 mg/dL — ABNORMAL HIGH (ref 70–99)
Potassium: 4.2 mmol/L (ref 3.5–5.2)
Sodium: 141 mmol/L (ref 134–144)
Total Protein: 6.6 g/dL (ref 6.0–8.5)
eGFR: 94 mL/min/{1.73_m2} (ref >59)

## 2023-07-23 ENCOUNTER — Other Ambulatory Visit: Payer: Self-pay | Admitting: Family

## 2023-07-23 DIAGNOSIS — M170 Bilateral primary osteoarthritis of knee: Secondary | ICD-10-CM

## 2023-08-03 ENCOUNTER — Encounter: Payer: Self-pay | Admitting: Family

## 2023-08-03 ENCOUNTER — Telehealth (INDEPENDENT_AMBULATORY_CARE_PROVIDER_SITE_OTHER): Payer: BC Managed Care – PPO | Admitting: Family

## 2023-08-03 DIAGNOSIS — N522 Drug-induced erectile dysfunction: Secondary | ICD-10-CM

## 2023-08-03 DIAGNOSIS — I10 Essential (primary) hypertension: Secondary | ICD-10-CM | POA: Diagnosis not present

## 2023-08-03 DIAGNOSIS — J449 Chronic obstructive pulmonary disease, unspecified: Secondary | ICD-10-CM | POA: Diagnosis not present

## 2023-08-03 DIAGNOSIS — F10281 Alcohol dependence with alcohol-induced sexual dysfunction: Secondary | ICD-10-CM

## 2023-08-03 DIAGNOSIS — F17200 Nicotine dependence, unspecified, uncomplicated: Secondary | ICD-10-CM

## 2023-08-03 DIAGNOSIS — R300 Dysuria: Secondary | ICD-10-CM | POA: Diagnosis not present

## 2023-08-03 DIAGNOSIS — N529 Male erectile dysfunction, unspecified: Secondary | ICD-10-CM | POA: Diagnosis not present

## 2023-08-03 DIAGNOSIS — I700 Atherosclerosis of aorta: Secondary | ICD-10-CM

## 2023-08-03 LAB — CBC WITH DIFFERENTIAL/PLATELET
Basophils Absolute: 0.1 10*3/uL (ref 0.0–0.2)
Basos: 1 %
EOS (ABSOLUTE): 0.3 10*3/uL (ref 0.0–0.4)
Eos: 7 %
Hematocrit: 43.6 % (ref 37.5–51.0)
Hemoglobin: 15.1 g/dL (ref 13.0–17.7)
Immature Grans (Abs): 0 10*3/uL (ref 0.0–0.1)
Immature Granulocytes: 0 %
Lymphocytes Absolute: 1.5 10*3/uL (ref 0.7–3.1)
Lymphs: 32 %
MCH: 32 pg (ref 26.6–33.0)
MCHC: 34.6 g/dL (ref 31.5–35.7)
MCV: 92 fL (ref 79–97)
Monocytes Absolute: 0.5 10*3/uL (ref 0.1–0.9)
Monocytes: 10 %
Neutrophils Absolute: 2.2 10*3/uL (ref 1.4–7.0)
Neutrophils: 50 %
Platelets: 293 10*3/uL (ref 150–450)
RBC: 4.72 x10E6/uL (ref 4.14–5.80)
RDW: 11.3 % — ABNORMAL LOW (ref 11.6–15.4)
WBC: 4.5 10*3/uL (ref 3.4–10.8)

## 2023-08-03 LAB — CMP14+EGFR
ALT: 27 [IU]/L (ref 0–44)
AST: 29 [IU]/L (ref 0–40)
Albumin: 4.3 g/dL (ref 3.9–4.9)
Alkaline Phosphatase: 63 [IU]/L (ref 44–121)
BUN/Creatinine Ratio: 9 — ABNORMAL LOW (ref 10–24)
BUN: 7 mg/dL — ABNORMAL LOW (ref 8–27)
Bilirubin Total: 1 mg/dL (ref 0.0–1.2)
CO2: 23 mmol/L (ref 20–29)
Calcium: 9.4 mg/dL (ref 8.6–10.2)
Chloride: 100 mmol/L (ref 96–106)
Creatinine, Ser: 0.81 mg/dL (ref 0.76–1.27)
Globulin, Total: 2.3 g/dL (ref 1.5–4.5)
Glucose: 98 mg/dL (ref 70–99)
Potassium: 3.9 mmol/L (ref 3.5–5.2)
Sodium: 138 mmol/L (ref 134–144)
Total Protein: 6.6 g/dL (ref 6.0–8.5)
eGFR: 96 mL/min/{1.73_m2} (ref >59)

## 2023-08-03 LAB — URINALYSIS, COMPLETE
Bilirubin, UA: NEGATIVE
Glucose, UA: NEGATIVE
Ketones, UA: NEGATIVE
Leukocytes,UA: NEGATIVE
Nitrite, UA: NEGATIVE
Protein,UA: NEGATIVE
RBC, UA: NEGATIVE
Specific Gravity, UA: 1.015 (ref 1.005–1.030)
Urobilinogen, Ur: 0.2 mg/dL (ref 0.2–1.0)
pH, UA: 7 (ref 5.0–7.5)

## 2023-08-03 LAB — MICROSCOPIC EXAMINATION
Bacteria, UA: NONE SEEN
Epithelial Cells (non renal): NONE SEEN /[HPF] (ref 0–10)
RBC, Urine: NONE SEEN /[HPF] (ref 0–2)
Renal Epithel, UA: NONE SEEN /[HPF]
WBC, UA: NONE SEEN /[HPF] (ref 0–5)
Yeast, UA: NONE SEEN

## 2023-08-03 MED ORDER — HYDROCHLOROTHIAZIDE 12.5 MG PO TABS: 12.5000 mg | ORAL_TABLET | Freq: Every day | ORAL | 3 refills | Status: DC

## 2023-08-03 MED ORDER — AMLODIPINE BESYLATE 10 MG PO TABS: 10.0000 mg | ORAL_TABLET | Freq: Every day | ORAL | 1 refills | Status: DC

## 2023-08-03 MED ORDER — SILDENAFIL CITRATE 100 MG PO TABS: ORAL_TABLET | ORAL | 2 refills | Status: DC

## 2023-08-03 MED ORDER — ALBUTEROL SULFATE HFA 108 (90 BASE) MCG/ACT IN AERS: 2.0000 | INHALATION_SPRAY | Freq: Four times a day (QID) | RESPIRATORY_TRACT | 2 refills | Status: DC | PRN

## 2023-08-03 MED ORDER — ATORVASTATIN CALCIUM 20 MG PO TABS: 20.0000 mg | ORAL_TABLET | Freq: Every day | ORAL | 3 refills | Status: DC

## 2023-08-03 MED ORDER — BUDESONIDE-FORMOTEROL FUMARATE 160-4.5 MCG/ACT IN AERO: INHALATION_SPRAY | RESPIRATORY_TRACT | 3 refills | Status: DC

## 2023-08-03 NOTE — Progress Notes (Signed)
 Virtual Visit Consent   Justin Gill, you are scheduled for a virtual visit with a [facility] provider today. Just as with appointments in the office, your consent must be obtained to participate. Your consent will be active for this visit and any virtual visit you may have with one of our providers in the next 365 days. If you have a MyChart account, a copy of this consent can be sent to you electronically.  As this is a virtual visit, video technology does not allow for your provider to perform a traditional examination. This may limit your provider's ability to fully assess your condition. If your provider identifies any concerns that need to be evaluated in person or the need to arrange testing (such as labs, EKG, etc.), we will make arrangements to do so. Although advances in technology are sophisticated, we cannot ensure that it will always work on either your end or our end. If the connection with a video visit is poor, the visit may have to be switched to a telephone visit. With either a video or telephone visit, we are not always able to ensure that we have a secure connection.  By engaging in this virtual visit, you consent to the provision of healthcare and authorize for your insurance to be billed (if applicable) for the services provided during this visit. Depending on your insurance coverage, you may receive a charge related to this service.  I need to obtain your verbal consent now. Are you willing to proceed with your visit today? Justin Gill has provided verbal consent on 08/03/2023 for a virtual visit (video or telephone). Bari Learn, FNP  Date: 08/03/2023 11:07 AM  Virtual Visit via Video Note   I, Bari Learn, connected with  Justin Gill  ([id number], 12/14/1955) on 08/03/23 at  9:25 AM EST by a video-enabled telemedicine application and verified that I am speaking with the correct person using two identifiers.  Location: Patient: Virtual Visit Location Patient: Home Provider:  Virtual Visit Location Provider: Home Office   I discussed the limitations of evaluation and management by telemedicine and the availability of in person appointments. The patient expressed understanding and agreed to proceed.    History of Present Illness: Justin Gill is a 68 y.o. who identifies as a male who was assigned male at birth, and is being seen today for or chronic follow up.    He is a current smoker for 46 years and currently smoke 1/2 pack a day. He also drinks 1-2 40 oz of beer a day.    He has COPD and states this is stable. He is currently taking Symbicort  BID as needed.    He has aortic atherosclerosis and has been prescribed Lipitor daily, but has not been taking regularly.    He has ED and takes Viagra  as needed. States this was working well, until we increased his BP medications on last visit. He has went back to work and has had increased stress.   He had a right hernia repair on 06/12/22 and doing well.  HPI: Dysuria  This is a new problem. The current episode started 1 to 4 weeks ago. The problem occurs intermittently. The quality of the pain is described as burning. The patient is experiencing no pain. Pertinent negatives include no hematuria, nausea or urgency. He has tried increased fluids for the symptoms. The treatment provided mild relief.  Hypertension This is a chronic problem. The current episode started more than 1 year ago. The problem has been resolved  since onset. The problem is controlled. Associated symptoms include malaise/fatigue and peripheral edema. Pertinent negatives include no shortness of breath. Risk factors for coronary artery disease include dyslipidemia, obesity, sedentary lifestyle and smoking/tobacco exposure. The current treatment provides moderate improvement.  Nicotine Dependence Presents for follow-up visit. His urge triggers include company of smokers. The symptoms have been stable. He smokes < 1/2 a pack of cigarettes per day.     Problems:  Patient Active Problem List   Diagnosis Date Noted   H/O right inguinal hernia repair 08/07/2022   Right inguinal hernia 06/12/2022   Aortic atherosclerosis (HCC) 05/09/2022   Renal mass, right 05/09/2022   Atopic dermatitis 12/14/2020   Elevated hemoglobin (HCC) 12/14/2020   Alcohol dependence (HCC) 11/30/2020   HTN (hypertension) 04/19/2015   COPD, moderate (HCC) 04/19/2015   Current smoker 04/19/2015   Erectile dysfunction 04/19/2015    Allergies:  Allergies  Allergen Reactions   Bee Venom Hives and Swelling   Medications:  Current Outpatient Medications:    albuterol  (VENTOLIN  HFA) 108 (90 Base) MCG/ACT inhaler, Inhale 2 puffs into the lungs every 6 (six) hours as needed for wheezing., Disp: 1 each, Rfl: 2   amLODipine  (NORVASC ) 10 MG tablet, Take 1 tablet (10 mg total) by mouth daily., Disp: 90 tablet, Rfl: 1   atorvastatin  (LIPITOR) 20 MG tablet, Take 1 tablet (20 mg total) by mouth daily., Disp: 90 tablet, Rfl: 3   budesonide -formoterol  (SYMBICORT ) 160-4.5 MCG/ACT inhaler, INHALE 2 PUFFS INTO THE LUNGS TWICE A DAY, Disp: 10.2 each, Rfl: 3   Cyanocobalamin (B-12 PO), Take 1 tablet by mouth daily., Disp: , Rfl:    diphenhydrAMINE  (BENADRYL ) 25 MG tablet, Take 25 mg by mouth every 6 (six) hours as needed., Disp: , Rfl:    docusate sodium  (COLACE) 100 MG capsule, Take 1 capsule (100 mg total) by mouth 2 (two) times daily., Disp: 60 capsule, Rfl: 2   fluticasone  (FLONASE ) 50 MCG/ACT nasal spray, Place 2 sprays into both nostrils daily., Disp: 16 g, Rfl: 6   hydrochlorothiazide  (HYDRODIURIL ) 12.5 MG tablet, Take 1 tablet (12.5 mg total) by mouth daily., Disp: 90 tablet, Rfl: 3   ibuprofen  (ADVIL ) 800 MG tablet, TAKE 1 TABLET BY MOUTH EVERY 8 HOURS AS NEEDED, Disp: 30 tablet, Rfl: 0   Multiple Vitamin (MULTIVITAMIN WITH MINERALS) TABS, Take 1 tablet by mouth daily., Disp: , Rfl:    pyridOXINE (VITAMIN B-6) 100 MG tablet, Take 100 mg by mouth daily., Disp: , Rfl:     sildenafil  (VIAGRA ) 100 MG tablet, TAKE 1 TABLET BY MOUTH AS NEEDED FOR ERECTILE DYSFUNCTION, Disp: 30 tablet, Rfl: 2   triamcinolone  cream (KENALOG ) 0.1 %, Apply 1 Application topically daily as needed (Rash)., Disp: 80 g, Rfl: 2  Observations/Objective: Patient is well-developed, well-nourished in no acute distress.  Resting comfortably  at home.  Head is normocephalic, atraumatic.  No labored breathing.  Speech is clear and coherent with logical content.  Patient is alert and oriented at baseline.    Assessment and Plan: 1. Primary hypertension (Primary) - CMP14+EGFR; Future - CBC with Differential/Platelet; Future - CBC with Differential/Platelet - CMP14+EGFR - amLODipine  (NORVASC ) 10 MG tablet; Take 1 tablet (10 mg total) by mouth daily.  Dispense: 90 tablet; Refill: 1 - hydrochlorothiazide  (HYDRODIURIL ) 12.5 MG tablet; Take 1 tablet (12.5 mg total) by mouth daily.  Dispense: 90 tablet; Refill: 3  2. COPD, moderate (HCC) - CMP14+EGFR; Future - CBC with Differential/Platelet; Future - CBC with Differential/Platelet - CMP14+EGFR - albuterol  (VENTOLIN  HFA) 108 (  90 Base) MCG/ACT inhaler; Inhale 2 puffs into the lungs every 6 (six) hours as needed for wheezing.  Dispense: 1 each; Refill: 2 - budesonide -formoterol  (SYMBICORT ) 160-4.5 MCG/ACT inhaler; INHALE 2 PUFFS INTO THE LUNGS TWICE A DAY  Dispense: 10.2 each; Refill: 3  3. Current smoker - CMP14+EGFR; Future - CBC with Differential/Platelet; Future - CBC with Differential/Platelet - CMP14+EGFR  4. Erectile dysfunction, unspecified erectile dysfunction type - CMP14+EGFR; Future - CBC with Differential/Platelet; Future - CBC with Differential/Platelet - CMP14+EGFR  5. Alcohol dependence with alcohol-induced sexual dysfunction (HCC) - CMP14+EGFR; Future - CBC with Differential/Platelet; Future - CBC with Differential/Platelet - CMP14+EGFR  6. Aortic atherosclerosis (HCC) - CMP14+EGFR; Future - CBC with  Differential/Platelet; Future - CBC with Differential/Platelet - CMP14+EGFR - atorvastatin  (LIPITOR) 20 MG tablet; Take 1 tablet (20 mg total) by mouth daily.  Dispense: 90 tablet; Refill: 3  7. Dysuria - CMP14+EGFR; Future - CBC with Differential/Platelet; Future - Urinalysis, Complete; Future - CBC with Differential/Platelet - CMP14+EGFR - Urinalysis, Complete  8. Drug-induced erectile dysfunction - sildenafil  (VIAGRA ) 100 MG tablet; TAKE 1 TABLET BY MOUTH AS NEEDED FOR ERECTILE DYSFUNCTION  Dispense: 30 tablet; Refill: 2  Continue current medications  Smoking and alcohol cessation discussed Labs pending  Follow up 6 months   Follow Up Instructions: I discussed the assessment and treatment plan with the patient. The patient was provided an opportunity to ask questions and all were answered. The patient agreed with the plan and demonstrated an understanding of the instructions.  A copy of instructions were sent to the patient via MyChart unless otherwise noted below.     The patient was advised to call back or seek an in-person evaluation if the symptoms worsen or if the condition fails to improve as anticipated.    Bari Learn, FNP

## 2023-08-06 ENCOUNTER — Other Ambulatory Visit: Payer: Self-pay | Admitting: Nurse Practitioner

## 2023-08-06 DIAGNOSIS — R0981 Nasal congestion: Secondary | ICD-10-CM

## 2023-08-10 ENCOUNTER — Ambulatory Visit: Payer: BC Managed Care – PPO

## 2023-09-28 ENCOUNTER — Other Ambulatory Visit: Payer: Self-pay | Admitting: Family

## 2023-09-28 DIAGNOSIS — M170 Bilateral primary osteoarthritis of knee: Secondary | ICD-10-CM

## 2023-10-20 ENCOUNTER — Other Ambulatory Visit: Payer: Self-pay | Admitting: Family

## 2023-10-20 DIAGNOSIS — J449 Chronic obstructive pulmonary disease, unspecified: Secondary | ICD-10-CM

## 2023-12-29 ENCOUNTER — Other Ambulatory Visit: Payer: Self-pay | Admitting: Family

## 2023-12-29 DIAGNOSIS — J449 Chronic obstructive pulmonary disease, unspecified: Secondary | ICD-10-CM

## 2024-02-16 ENCOUNTER — Other Ambulatory Visit: Payer: Self-pay | Admitting: Family

## 2024-02-16 DIAGNOSIS — J449 Chronic obstructive pulmonary disease, unspecified: Secondary | ICD-10-CM

## 2024-02-27 ENCOUNTER — Other Ambulatory Visit: Payer: Self-pay | Admitting: Nurse Practitioner

## 2024-02-27 DIAGNOSIS — R0981 Nasal congestion: Secondary | ICD-10-CM

## 2024-03-17 ENCOUNTER — Other Ambulatory Visit: Payer: Self-pay | Admitting: Family

## 2024-03-17 DIAGNOSIS — N522 Drug-induced erectile dysfunction: Secondary | ICD-10-CM

## 2024-03-17 DIAGNOSIS — J449 Chronic obstructive pulmonary disease, unspecified: Secondary | ICD-10-CM

## 2024-04-15 ENCOUNTER — Other Ambulatory Visit: Payer: Self-pay | Admitting: Family

## 2024-04-15 ENCOUNTER — Encounter: Payer: Self-pay | Admitting: Family

## 2024-04-15 DIAGNOSIS — J449 Chronic obstructive pulmonary disease, unspecified: Secondary | ICD-10-CM

## 2024-04-15 DIAGNOSIS — N522 Drug-induced erectile dysfunction: Secondary | ICD-10-CM

## 2024-04-15 NOTE — Telephone Encounter (Signed)
 Christy pt NTBS 30-d given 03/17/24

## 2024-04-15 NOTE — Telephone Encounter (Signed)
 NA letter mailed

## 2024-04-16 ENCOUNTER — Other Ambulatory Visit: Payer: Self-pay | Admitting: Family

## 2024-04-16 DIAGNOSIS — I10 Essential (primary) hypertension: Secondary | ICD-10-CM

## 2024-05-14 ENCOUNTER — Other Ambulatory Visit: Payer: Self-pay | Admitting: Family

## 2024-05-14 DIAGNOSIS — I10 Essential (primary) hypertension: Secondary | ICD-10-CM

## 2024-05-16 ENCOUNTER — Encounter: Payer: Self-pay | Admitting: Family

## 2024-05-16 NOTE — Telephone Encounter (Signed)
 Letter mailed

## 2024-05-16 NOTE — Telephone Encounter (Signed)
 Christy pt NTBS 30-d given 04/18/24

## 2024-06-13 ENCOUNTER — Encounter: Payer: Self-pay | Admitting: Family

## 2024-06-13 ENCOUNTER — Telehealth: Payer: Self-pay | Admitting: Family

## 2024-06-13 ENCOUNTER — Ambulatory Visit (INDEPENDENT_AMBULATORY_CARE_PROVIDER_SITE_OTHER): Admitting: Family

## 2024-06-13 VITALS — BP 120/68 | HR 72 | Temp 97.5°F | Ht 72.0 in | Wt 165.0 lb

## 2024-06-13 DIAGNOSIS — Z9889 Other specified postprocedural states: Secondary | ICD-10-CM

## 2024-06-13 DIAGNOSIS — J449 Chronic obstructive pulmonary disease, unspecified: Secondary | ICD-10-CM | POA: Diagnosis not present

## 2024-06-13 DIAGNOSIS — F17200 Nicotine dependence, unspecified, uncomplicated: Secondary | ICD-10-CM

## 2024-06-13 DIAGNOSIS — M170 Bilateral primary osteoarthritis of knee: Secondary | ICD-10-CM

## 2024-06-13 DIAGNOSIS — Z0000 Encounter for general adult medical examination without abnormal findings: Secondary | ICD-10-CM

## 2024-06-13 DIAGNOSIS — Z0001 Encounter for general adult medical examination with abnormal findings: Secondary | ICD-10-CM | POA: Diagnosis not present

## 2024-06-13 DIAGNOSIS — I700 Atherosclerosis of aorta: Secondary | ICD-10-CM

## 2024-06-13 DIAGNOSIS — K0889 Other specified disorders of teeth and supporting structures: Secondary | ICD-10-CM

## 2024-06-13 DIAGNOSIS — F10281 Alcohol dependence with alcohol-induced sexual dysfunction: Secondary | ICD-10-CM

## 2024-06-13 DIAGNOSIS — I10 Essential (primary) hypertension: Secondary | ICD-10-CM

## 2024-06-13 DIAGNOSIS — Z8719 Personal history of other diseases of the digestive system: Secondary | ICD-10-CM

## 2024-06-13 DIAGNOSIS — N529 Male erectile dysfunction, unspecified: Secondary | ICD-10-CM | POA: Diagnosis not present

## 2024-06-13 MED ORDER — IBUPROFEN 800 MG PO TABS: 800.0000 mg | ORAL_TABLET | Freq: Three times a day (TID) | ORAL | 3 refills | Status: AC | PRN

## 2024-06-13 MED ORDER — HYDROCHLOROTHIAZIDE 12.5 MG PO TABS: 12.5000 mg | ORAL_TABLET | Freq: Every day | ORAL | 3 refills | Status: AC

## 2024-06-13 MED ORDER — BUDESONIDE-FORMOTEROL FUMARATE 160-4.5 MCG/ACT IN AERO: INHALATION_SPRAY | RESPIRATORY_TRACT | 3 refills | Status: AC

## 2024-06-13 MED ORDER — ATORVASTATIN CALCIUM 20 MG PO TABS: 20.0000 mg | ORAL_TABLET | Freq: Every day | ORAL | 3 refills | Status: AC

## 2024-06-13 MED ORDER — AMOXICILLIN-POT CLAVULANATE 875-125 MG PO TABS: 1.0000 | ORAL_TABLET | Freq: Two times a day (BID) | ORAL | 0 refills | Status: AC

## 2024-06-13 MED ORDER — AMLODIPINE BESYLATE 10 MG PO TABS: 10.0000 mg | ORAL_TABLET | Freq: Every day | ORAL | 1 refills | Status: AC

## 2024-06-13 NOTE — Progress Notes (Signed)
 Subjective:    Patient ID: Justin Gill, male    DOB: 1956-05-19, 68 y.o.   MRN: [id number]  Chief Complaint  Patient presents with   Medical Management of Chronic Issues    Wants antobotic or oral rinse for his teethe until he can see the dentist    PT presents to the office today for CPE.    He is a current smoker for 46 years and currently smoke < 1/2 pack a day. He also drinks 5 of the 16 oz of beers a week .    He has COPD and states this is stable. He is currently taking Symbicort  BID most of the time.   He has aortic atherosclerosis taking Lipitor.    He has ED and takes Viagra  as needed. States this was working well.  Reports he is having right lower gum pain. Has multiple broken teeth. Denies any fever and is able to eat and drink.    He had a right hernia repair on 06/12/22 and doing well. Hypertension This is a chronic problem. The current episode started more than 1 year ago. The problem has been waxing and waning since onset. The problem is uncontrolled. Associated symptoms include malaise/fatigue and peripheral edema (some times after working). Pertinent negatives include no shortness of breath. Risk factors for coronary artery disease include obesity. The current treatment provides moderate improvement.  Nicotine Dependence Presents for follow-up visit. He smokes < 1/2 a pack of cigarettes per day.  Hyperlipidemia This is a chronic problem. The current episode started more than 1 year ago. The problem is controlled. Recent lipid tests were reviewed and are normal. Pertinent negatives include no shortness of breath. Current antihyperlipidemic treatment includes statins. The current treatment provides moderate improvement of lipids. Risk factors for coronary artery disease include male sex and a sedentary lifestyle.      Review of Systems  Constitutional:  Positive for malaise/fatigue.  Respiratory:  Negative for shortness of breath.   All other systems reviewed  and are negative.      Objective:   Physical Exam Vitals reviewed.  Constitutional:      General: He is not in acute distress.    Appearance: He is well-developed.  HENT:     Head: Normocephalic.     Right Ear: Tympanic membrane normal.     Left Ear: Tympanic membrane normal.     Mouth/Throat:     Dentition: Dental tenderness, gingival swelling and dental caries present.     Comments: Right lower gum swelling Eyes:     General:        Right eye: No discharge.        Left eye: No discharge.     Pupils: Pupils are equal, round, and reactive to light.  Neck:     Thyroid : No thyromegaly.  Cardiovascular:     Rate and Rhythm: Normal rate and regular rhythm.     Heart sounds: Normal heart sounds. No murmur heard. Pulmonary:     Effort: Pulmonary effort is normal. No respiratory distress.     Breath sounds: No wheezing or rhonchi.  Abdominal:     General: Bowel sounds are normal. There is no distension.     Palpations: Abdomen is soft.     Tenderness: There is no abdominal tenderness.  Musculoskeletal:        General: No tenderness. Normal range of motion.     Cervical back: Normal range of motion and neck supple.  Skin:    General:  Skin is warm and dry.     Findings: No erythema or rash.  Neurological:     Mental Status: He is alert and oriented to person, place, and time.     Cranial Nerves: No cranial nerve deficit.     Deep Tendon Reflexes: Reflexes are normal and symmetric.  Psychiatric:        Behavior: Behavior normal.        Thought Content: Thought content normal.        Judgment: Judgment normal.       BP (!) 157/87   Pulse 72   Temp (!) 97.5 F (36.4 C) (Temporal)   Ht 6' (1.829 m)   Wt 165 lb (74.8 kg)   BMI 22.38 kg/m      Assessment & Plan:  Justin Gill comes in today with chief complaint of Medical Management of Chronic Issues (Wants antobotic or oral rinse for his teethe until he can see the dentist )   Diagnosis and orders addressed:  1.  Primary hypertension - amLODipine  (NORVASC ) 10 MG tablet; Take 1 tablet (10 mg total) by mouth daily. **NEEDS TO BE SEEN BEFORE NEXT REFILL**  Dispense: 90 tablet; Refill: 1 - hydrochlorothiazide  (HYDRODIURIL ) 12.5 MG tablet; Take 1 tablet (12.5 mg total) by mouth daily.  Dispense: 90 tablet; Refill: 3 - CMP14+EGFR - CBC with Differential/Platelet - TSH  2. Aortic atherosclerosis - atorvastatin  (LIPITOR) 20 MG tablet; Take 1 tablet (20 mg total) by mouth daily.  Dispense: 90 tablet; Refill: 3 - CMP14+EGFR - CBC with Differential/Platelet - Lipid panel  3. COPD, moderate (HCC) - budesonide -formoterol  (SYMBICORT ) 160-4.5 MCG/ACT inhaler; INHALE 2 PUFFS INTO THE LUNGS TWICE A DAY **NEEDS TO BE SEEN BEFORE NEXT REFILL**  Dispense: 10.2 each; Refill: 3 - CMP14+EGFR - CBC with Differential/Platelet  4. Annual physical exam (Primary) - CMP14+EGFR - CBC with Differential/Platelet - Lipid panel - Vitamin B12 - TSH  5. H/O right inguinal hernia repair - CMP14+EGFR - CBC with Differential/Platelet  6. Erectile dysfunction, unspecified erectile dysfunction type - CMP14+EGFR - CBC with Differential/Platelet  7. Current smoker - CMP14+EGFR - CBC with Differential/Platelet  8. Alcohol dependence with alcohol-induced sexual dysfunction (HCC) - CMP14+EGFR - CBC with Differential/Platelet - Vitamin B12 - TSH  9. Primary osteoarthritis of both knees  10. Pain, dental Start Augmentin  Follow up with Dentist  - amoxicillin -clavulanate (AUGMENTIN ) 875-125 MG tablet; Take 1 tablet by mouth 2 (two) times daily.  Dispense: 14 tablet; Refill: 0 - ibuprofen  (ADVIL ) 800 MG tablet; Take 1 tablet (800 mg total) by mouth every 8 (eight) hours as needed.  Dispense: 30 tablet; Refill: 3   Labs pending Continue current medications  Pt will follow up dentist  Health Maintenance reviewed Diet and exercise encouraged  Follow up plan: 6 months    Bari Learn, FNP

## 2024-06-13 NOTE — Telephone Encounter (Signed)
 Patient is bringing ppw back for his job and states Bari told him not to pay for it because he already had his appt.

## 2024-06-13 NOTE — Patient Instructions (Signed)
 Health Maintenance After Age 69 After age 27, you are at a higher risk for certain long-term diseases and infections as well as injuries from falls. Falls are a major cause of broken bones and head injuries in people who are older than age 73. Getting regular preventive care can help to keep you healthy and well. Preventive care includes getting regular testing and making lifestyle changes as recommended by your health care provider. Talk with your health care provider about: Which screenings and tests you should have. A screening is a test that checks for a disease when you have no symptoms. A diet and exercise plan that is right for you. What should I know about screenings and tests to prevent falls? Screening and testing are the best ways to find a health problem early. Early diagnosis and treatment give you the best chance of managing medical conditions that are common after age 90. Certain conditions and lifestyle choices may make you more likely to have a fall. Your health care provider may recommend: Regular vision checks. Poor vision and conditions such as cataracts can make you more likely to have a fall. If you wear glasses, make sure to get your prescription updated if your vision changes. Medicine review. Work with your health care provider to regularly review all of the medicines you are taking, including over-the-counter medicines. Ask your health care provider about any side effects that may make you more likely to have a fall. Tell your health care provider if any medicines that you take make you feel dizzy or sleepy. Strength and balance checks. Your health care provider may recommend certain tests to check your strength and balance while standing, walking, or changing positions. Foot health exam. Foot pain and numbness, as well as not wearing proper footwear, can make you more likely to have a fall. Screenings, including: Osteoporosis screening. Osteoporosis is a condition that causes  the bones to get weaker and break more easily. Blood pressure screening. Blood pressure changes and medicines to control blood pressure can make you feel dizzy. Depression screening. You may be more likely to have a fall if you have a fear of falling, feel depressed, or feel unable to do activities that you used to do. Alcohol  use screening. Using too much alcohol  can affect your balance and may make you more likely to have a fall. Follow these instructions at home: Lifestyle Do not drink alcohol  if: Your health care provider tells you not to drink. If you drink alcohol : Limit how much you have to: 0-1 drink a day for women. 0-2 drinks a day for men. Know how much alcohol  is in your drink. In the [place] one drink equals one 12 oz bottle of beer (355 mL), one 5 oz glass of wine (148 mL), or one 1 oz glass of hard liquor (44 mL). Do not use any products that contain nicotine or tobacco. These products include cigarettes, chewing tobacco, and vaping devices, such as e-cigarettes. If you need help quitting, ask your health care provider. Activity  Follow a regular exercise program to stay fit. This will help you maintain your balance. Ask your health care provider what types of exercise are appropriate for you. If you need a cane or walker, use it as recommended by your health care provider. Wear supportive shoes that have nonskid soles. Safety  Remove any tripping hazards, such as rugs, cords, and clutter. Install safety equipment such as grab bars in bathrooms and safety rails on stairs. Keep rooms and walkways  well-lit. General instructions Talk with your health care provider about your risks for falling. Tell your health care provider if: You fall. Be sure to tell your health care provider about all falls, even ones that seem minor. You feel dizzy, tiredness (fatigue), or off-balance. Take over-the-counter and prescription medicines only as told by your health care provider. These include  supplements. Eat a healthy diet and maintain a healthy weight. A healthy diet includes low-fat dairy products, low-fat (lean) meats, and fiber from whole grains, beans, and lots of fruits and vegetables. Stay current with your vaccines. Schedule regular health, dental, and eye exams. Summary Having a healthy lifestyle and getting preventive care can help to protect your health and wellness after age 15. Screening and testing are the best way to find a health problem early and help you avoid having a fall. Early diagnosis and treatment give you the best chance for managing medical conditions that are more common for people who are older than age 42. Falls are a major cause of broken bones and head injuries in people who are older than age 64. Take precautions to prevent a fall at home. Work with your health care provider to learn what changes you can make to improve your health and wellness and to prevent falls. This information is not intended to replace advice given to you by your health care provider. Make sure you discuss any questions you have with your health care provider. Document Revised: 12/03/2020 Document Reviewed: 12/03/2020 Elsevier Patient Education  2024 [employer].

## 2024-06-14 ENCOUNTER — Ambulatory Visit: Payer: Self-pay | Admitting: Family

## 2024-06-14 LAB — CBC WITH DIFFERENTIAL/PLATELET
Basophils Absolute: 0.1 x10E3/uL (ref 0.0–0.2)
Basos: 2 %
EOS (ABSOLUTE): 0.4 x10E3/uL (ref 0.0–0.4)
Eos: 8 %
Hematocrit: 44.7 % (ref 37.5–51.0)
Hemoglobin: 15.2 g/dL (ref 13.0–17.7)
Immature Grans (Abs): 0.1 x10E3/uL (ref 0.0–0.1)
Immature Granulocytes: 1 %
Lymphocytes Absolute: 2.3 x10E3/uL (ref 0.7–3.1)
Lymphs: 44 %
MCH: 32.1 pg (ref 26.6–33.0)
MCHC: 34 g/dL (ref 31.5–35.7)
MCV: 94 fL (ref 79–97)
Monocytes Absolute: 0.6 x10E3/uL (ref 0.1–0.9)
Monocytes: 11 %
Neutrophils Absolute: 1.7 x10E3/uL (ref 1.4–7.0)
Neutrophils: 34 %
Platelets: 296 x10E3/uL (ref 150–450)
RBC: 4.74 x10E6/uL (ref 4.14–5.80)
RDW: 12.4 % (ref 11.6–15.4)
WBC: 5.1 x10E3/uL (ref 3.4–10.8)

## 2024-06-14 LAB — CMP14+EGFR
ALT: 26 IU/L (ref 0–44)
AST: 26 IU/L (ref 0–40)
Albumin: 4.4 g/dL (ref 3.9–4.9)
Alkaline Phosphatase: 67 IU/L (ref 47–123)
BUN/Creatinine Ratio: 6 — ABNORMAL LOW (ref 10–24)
BUN: 6 mg/dL — ABNORMAL LOW (ref 8–27)
Bilirubin Total: 0.6 mg/dL (ref 0.0–1.2)
CO2: 23 mmol/L (ref 20–29)
Calcium: 9.5 mg/dL (ref 8.6–10.2)
Chloride: 104 mmol/L (ref 96–106)
Creatinine, Ser: 0.98 mg/dL (ref 0.76–1.27)
Globulin, Total: 2.7 g/dL (ref 1.5–4.5)
Glucose: 99 mg/dL (ref 70–99)
Potassium: 4.4 mmol/L (ref 3.5–5.2)
Sodium: 140 mmol/L (ref 134–144)
Total Protein: 7.1 g/dL (ref 6.0–8.5)
eGFR: 83 mL/min/1.73 (ref >59)

## 2024-06-14 LAB — LIPID PANEL
Chol/HDL Ratio: 2.1 ratio (ref 0.0–5.0)
Cholesterol, Total: 132 mg/dL (ref 100–199)
HDL: 64 mg/dL (ref >39)
LDL Chol Calc (NIH): 52 mg/dL (ref 0–99)
Triglycerides: 82 mg/dL (ref 0–149)
VLDL Cholesterol Cal: 16 mg/dL (ref 5–40)

## 2024-06-14 LAB — TSH: TSH: 1.53 u[IU]/mL (ref 0.450–4.500)

## 2024-06-14 LAB — VITAMIN B12: Vitamin B-12: 1076 pg/mL (ref 232–1245)

## 2024-08-30 ENCOUNTER — Other Ambulatory Visit: Payer: Self-pay | Admitting: Family

## 2024-08-30 DIAGNOSIS — R0981 Nasal congestion: Secondary | ICD-10-CM
